# Patient Record
Sex: Male | Born: 1940
Health system: Southern US, Community
[De-identification: ages and names within clinical notes are randomized; demographics above are authoritative.]

## PROBLEM LIST (undated history)

## (undated) DIAGNOSIS — IMO0001 Reserved for inherently not codable concepts without codable children: Secondary | ICD-10-CM

## (undated) DIAGNOSIS — C4401 Basal cell carcinoma of skin of lip: Secondary | ICD-10-CM

## (undated) DIAGNOSIS — R569 Unspecified convulsions: Secondary | ICD-10-CM

## (undated) DIAGNOSIS — I10 Essential (primary) hypertension: Secondary | ICD-10-CM

## (undated) DIAGNOSIS — I2510 Atherosclerotic heart disease of native coronary artery without angina pectoris: Secondary | ICD-10-CM

## (undated) DIAGNOSIS — E785 Hyperlipidemia, unspecified: Secondary | ICD-10-CM

## (undated) DIAGNOSIS — N200 Calculus of kidney: Secondary | ICD-10-CM

## (undated) DIAGNOSIS — L409 Psoriasis, unspecified: Secondary | ICD-10-CM

## (undated) DIAGNOSIS — I4891 Unspecified atrial fibrillation: Secondary | ICD-10-CM

## (undated) DIAGNOSIS — I5030 Unspecified diastolic (congestive) heart failure: Secondary | ICD-10-CM

## (undated) DIAGNOSIS — E039 Hypothyroidism, unspecified: Secondary | ICD-10-CM

## (undated) HISTORY — DX: Psoriasis, unspecified: L40.9

## (undated) HISTORY — DX: Essential (primary) hypertension: I10

## (undated) HISTORY — DX: Basal cell carcinoma of skin of lip: C44.01

## (undated) HISTORY — DX: Calculus of kidney: N20.0

## (undated) HISTORY — DX: Unspecified diastolic (congestive) heart failure: I50.30

## (undated) HISTORY — DX: Hyperlipidemia, unspecified: E78.5

## (undated) HISTORY — DX: Hypothyroidism, unspecified: E03.9

## (undated) HISTORY — DX: Unspecified atrial fibrillation: I48.91

## (undated) HISTORY — DX: Atherosclerotic heart disease of native coronary artery without angina pectoris: I25.10

## (undated) HISTORY — DX: Reserved for inherently not codable concepts without codable children: IMO0001

---

## 1980-03-16 HISTORY — PX: TIBIA FRACTURE SURGERY: SHX806

## 1985-03-16 HISTORY — PX: KIDNEY STONE SURGERY: SHX686

## 2003-03-17 DIAGNOSIS — I2510 Atherosclerotic heart disease of native coronary artery without angina pectoris: Secondary | ICD-10-CM

## 2003-03-17 DIAGNOSIS — IMO0001 Reserved for inherently not codable concepts without codable children: Secondary | ICD-10-CM

## 2003-03-17 HISTORY — PX: CORONARY ANGIOPLASTY WITH STENT PLACEMENT: SHX49

## 2003-03-17 HISTORY — DX: Atherosclerotic heart disease of native coronary artery without angina pectoris: I25.10

## 2003-03-17 HISTORY — DX: Reserved for inherently not codable concepts without codable children: IMO0001

## 2003-03-17 HISTORY — PX: CARDIAC CATHETERIZATION: SHX172

## 2006-03-16 HISTORY — PX: HERNIA REPAIR: SHX51

## 2007-03-17 HISTORY — PX: SKIN CANCER EXCISION: SHX779

## 2009-01-01 ENCOUNTER — Ambulatory Visit: Payer: Self-pay | Admitting: Cardiovascular Disease

## 2009-04-03 ENCOUNTER — Ambulatory Visit: Payer: Self-pay | Admitting: Cardiovascular Disease

## 2009-09-06 ENCOUNTER — Ambulatory Visit: Payer: Self-pay | Admitting: Cardiovascular Disease

## 2009-09-12 ENCOUNTER — Ambulatory Visit: Payer: Self-pay

## 2009-09-12 ENCOUNTER — Encounter: Payer: Self-pay | Admitting: Cardiovascular Disease

## 2010-02-25 ENCOUNTER — Ambulatory Visit: Payer: Self-pay | Admitting: Cardiovascular Disease

## 2010-02-28 ENCOUNTER — Telehealth: Payer: Self-pay | Admitting: Cardiovascular Disease

## 2010-02-28 ENCOUNTER — Telehealth (INDEPENDENT_AMBULATORY_CARE_PROVIDER_SITE_OTHER): Payer: Self-pay | Admitting: *Deleted

## 2010-04-17 NOTE — Progress Notes (Signed)
Summary: stress test results  Phone Note Outgoing Call   Call placed by: Dessie Coma  LPN,  February 28, 2010 12:00 PM Call placed to: Patient Summary of Call: LMVM-notified patient per Dr. Kirke Corin, stress test was normal.  To call if any questions.

## 2010-04-17 NOTE — Progress Notes (Signed)
----   Converted from flag ---- ---- 02/26/2010 11:01 AM, Marilynne Halsted, CMA, AAMA wrote: No precert required.  Pt has Medicare and BCBS Medicare supplement.  ---- 02/25/2010 10:50 AM, Dessie Coma  LPN wrote: Nuclear Stress Test scheduled at [facility]. for 02/27/10 with Dx: 786.50.  Thanks,Jennifer ------------------------------

## 2010-07-29 NOTE — Assessment & Plan Note (Signed)
[facility]                        [place] CARDIOLOGY OFFICE NOTE   NAME:[name] [name], [name] [name]                      MRN:          [id number]  DATE:09/06/2009                            DOB:          05/15/1940    PROBLEM LIST:  1. Coronary artery disease status post Taxus 3.5- x 16-mm stent to the      RCA in 2005.  2. Hypertension.  3. Hyperlipidemia.  4. Hypothyroidism.  5. Remote history of tobacco use.   INTERVAL HISTORY:  The patient states since his last visit, he has been  doing well.  He has been doing some walking with his wife perhaps 2 to 3  times a week for 50-20 minutes each time.  He denies any episodes of  chest discomfort or dyspnea.  He has been compliant with his medications  and is doing well off the Plavix, which we stopped at his last office  visit.   SOCIAL HISTORY:  History of tobacco use.   PHYSICAL EXAMINATION:  VITAL SIGNS:  Blood pressure 104/69, pulse 69,  satting 97% on room air, and he weighs 221 pounds.  GENERAL:  No acute distress.  HEENT:  Normocephalic, atraumatic.  NECK:  Supple.  No JVD.  No carotid bruits.  HEART:  Regular rate and rhythm without murmur, rub, or gallop.  LUNGS:  Clear bilaterally.  ABDOMEN:  Soft, nontender, nondistended.  EXTREMITIES:  Trace edema.   Review of the patient's blood work drawn the day of his last office  visit, his CMP was within normal limits.  Total cholesterol was 139, HDL  56, triglyceride 66, LDL 70.   ASSESSMENT AND PLAN:  1. Coronary artery disease.  The patient is not having any angina.  He      should continue on aspirin 162 mg daily.  He has not had any issues      off his Plavix.  He is on a good dose of beta-blocker, statin,      vitamin D, and fish oil.  ACE inhibitor has not been started as his      ejection fraction is normal and his blood pressure is a bit on the      low side.  2. Hyperlipidemia.  His LDL is at goal on simvastatin 40 mg daily,      which he  should continue.  3. Hypertension.  Blood pressure is under excellent control.  He      should continue on Coreg.  4. We will order an one-time screening abdominal aortic aneurysm as      the patient does have a      history of tobacco use.  5. The patient is again encouraged to increase his physical activity      and keep an eye on his weight as he is a couple of pounds heavier      now than he was several months ago.     Brayton El, MD  Electronically Signed    SGA/MedQ  DD: 09/06/2009  DT: 09/07/2009  Job #: [id number]

## 2010-07-29 NOTE — Letter (Signed)
January 01, 2009    Philemon Kingdom, MD  [address]  [place] [address]   RE:  [name], [name]  MRN:  [id number]  /  DOB:  April 17, 1940   Dear Dr. Sudie Bailey:   I had the pleasure of seeing your patient, [name] [name], in  cardiology clinic today.  As you know, he is a 70 year old gentleman  with a history of coronary artery disease status post drug eluting stent  placement in 2005 who is presenting to me to establish cardiovascular  care.  The patient appears to be doing well from a cardiac standpoint.  He is not having any symptoms consistent with angina or heart failure.  His LDL is not yet under goal so I have increased his simvastatin to 40  mg daily.  I am in the process of obtaining his catheterization records  from [place].  At his next office visit, I will discuss his  antiplatelet regimen with him.  Although not unreasonable to continue  both aspirin and Plavix together; per the guidelines, he probably only  needs to be taking aspirin alone.  He is on a good medical therapy  otherwise.  I plan on seeing him back in 3 months time.   Thank you for the referral of [name] [name].  Please contact my office if  I can be of further assistance.    Sincerely,      Brayton El, MD  Electronically Signed    SGA/MedQ  DD: 01/01/2009  DT: 01/01/2009  Job #: [id number]

## 2010-07-29 NOTE — Assessment & Plan Note (Signed)
[facility]                        [place] CARDIOLOGY OFFICE NOTE   NAME:[name] [name], [name] [name]                      MRN:          [id number]  DATE:01/01/2009                            DOB:          06/28/1940    CHIEF COMPLAINT:  Establishing cardiovascular care.   HISTORY OF PRESENT ILLNESS:  [name] [name] is a 70 year old white male  with past medical history significant for coronary artery disease status  post PCI to the right coronary artery in 2005, who is now presenting to  establish cardiovascular care.  The patient states back in 2005, he was  told as an outpatient that he had heart attack based on his EKG.  He was  sent home from this visit, subsequently had some chest discomfort, was  admitted the hospital and had a Taxus stent placed in his right coronary  artery.  The patient states that the post-procedure course was  uneventful.  He states that over the past 5 years, he has not had any  recurrence of the chest discomfort.  He maintains a relatively active  life and that he does a lot of gardening and yard work.  The patient  denies any dyspnea on exertion, lower extremity edema, PND, orthopnea,  dizziness, or syncope.  He does state that he bruises rather easily as  he has been on full dose aspirin and Plavix ever since his intervention.   PAST MEDICAL HISTORY:  Coronary artery disease status post Taxus stent  (3.5 x 16 mm) to the right coronary artery in 2005, hypertension,  hyperlipidemia, basal cell skin cancer, arthritis.   SOCIAL HISTORY:  History of tobacco use but has not smoked in many  years, very rare alcohol use.   FAMILY HISTORY:  Positive for premature coronary disease.   ALLERGIES:  No known drug allergies.   MEDICATIONS:  1. Aspirin 325 daily.  2. Plavix 75 mg daily.  3. Coreg 12.5 mg b.i.d.  4. Simvastatin 20 mg daily.  5. Levothyroxine 25 mcg daily.  6. Vitamin D 400 international units daily.  7. Fish oil 1000 mg  b.i.d.  8. Glucosamine p.r.n.  9. Multiple skin creams for rosacea.  10.Nitroglycerin p.r.n.   REVIEW OF SYSTEMS:  The patient does endorse easy bruisability.  Other  systems as in HPI.  All other systems were reviewed and are negative.   PHYSICAL EXAMINATION:  VITAL SIGNS:  His blood pressure is 141/84, his  pulse is 57.  He weighs 218 pounds and he is sating 99% on room air.  GENERAL:  No acute distress.  HEENT:  Normocephalic, atraumatic.  He has a well-healed scar in his  left ear and over the right portion of his upper lip secondary to  dermatologic intervention.  NECK:  Supple.  There is no JVD.  There are no carotid bruits.  HEART:  Regular rate and rhythm without murmur, rub, or gallop.  LUNGS:  Clear to auscultation.  ABDOMEN:  Soft, nontender, nondistended.  EXTREMITIES:  Without edema.  SKIN:  Warm and dry.  EXTREMITIES:  Pulses 2+ bilateral carotid, radial and posterior tibial  pulses.  NEURO:  Nonfocal.  PSYCHIATRIC:  The patient is appropriate with normal levels of insight.   EKG from today independently interpreted by myself demonstrates normal  sinus bradycardia, with a ventricular rate of 55 beats per minute ,  evidence of an old inferior myocardial infarction approximately 0.5-mm  elevation in the inferior anterior lateral leads consistent with a early  repolarization.  I reviewed the patient's fasting lipid profile dated  December 31, 2008, total cholesterol 156, triglycerides 95, HDL 45, LDL  91.  Other labs from that visit are pending.  Of note, his blood  pressure during that visit was 122/83.   ASSESSMENT/PLAN:  1. Coronary artery disease - the patient is not having any symptoms      consistent with angina.  He is currently on aspirin 325 mg, Plavix      75 mg, and Coreg 12.5 mg b.i.d.  We would like to obtain the      results of his heart catheterization from 2005.  Today, we will      have the patient decrease his aspirin dose to 81 mg daily.  After       my review of the heart catheterization in that his next      appointment, we will consider further discussion with the patient      regarding the dual antiplatelet therapy.  A guideline stated that      only aspirin by itself is necessary this far out after      intervention.  Therefore, we will consider stopping the Plavix at      his next office visit.  2. Hyperlipidemia.  The patient is on simvastatin 20 mg daily with an      LDL of 91.  Today, we will increase the simvastatin to 40 mg daily      with the hopes of getting closer to his goal of 70.  3. Hypertension.  The patient's blood pressure is minimally elevated      today.  However, yesterday at Dr. Donita Brooks office, blood pressure      was within normal limits.  The patient also states that he checks      his blood pressure when he is out shopping and that it is almost      always within normal limits during those times.  Therefore, we will      not make any changes to his antihypertensive regimen now.  4. Left on supplements - the patient should continue on his fish oil      and vitamin D.  He is also recommended that he increase his      exercise regimen.  Ideally, this should be 30-60 minutes everyday      of the week.  He is instructed to contact our office if he were to      develop any chest discomfort or increase in dyspnea on exertion.      We will see the patient back in clinic in 3 months' time to monitor      his progress and to discuss his antiplatelet regimen.    Brayton El, MD  Electronically Signed   SGA/MedQ  DD: 01/01/2009  DT: 01/02/2009  Job #: [id number]

## 2010-07-29 NOTE — Assessment & Plan Note (Signed)
[facility]                        [place] CARDIOLOGY OFFICE NOTE   NAME:[name] [name], [name] [name]                      MRN:          [id number]  DATE:02/25/2010                            DOB:          09/26/1940    [name] [name] is a 70 year old gentleman who is here today for a followup  visit.  He has the following problem list:  1. Coronary artery disease status post myocardial infarction in 2005.      Cardiac catheterization at that time showed 99% mid right coronary      artery stenosis, 70% proximal ostial stenosis of the posterolateral      branch of the right coronary artery as well as moderate ostial      diagonal disease.  He underwent an angioplasty to the      posterolateral branch as well as angioplasty and a drug-eluting      stent placement to the mid right coronary artery with a Taxus 3.5 x      16-mm stent.  This was done in [facility].  2. Hypertension.  3. Hyperlipidemia.  4. Hypothyroidism.  5. Remote history of tobacco use.   INTERIM HISTORY:  The patient overall has been doing reasonably well.  Recently though he had some upper respiratory infection with bronchitis.  During that time he did not feel well and he had few episodes of chest  discomfort which is described as soreness in the chest with no  radiation.  He does have mild exertional dyspnea which has been overall  stable.  He has not had any recent cardiac evaluation with a stress  test.   MEDICATIONS:  1. Aspirin 162 mg once daily.  2. Carvedilol 12.5 mg twice daily.  3. Levothyroxine 25 mcg once daily.  4. Vitamin D once daily.  5. Fish oil 1000 mg twice daily.  6. Glucosamine  7. Simvastatin 40 mg at bedtime.   PHYSICAL EXAMINATION:  VITAL SIGNS: Weight is 223.6 pounds, blood  pressure is 116/71, pulse is 64, oxygen saturation is 97% on room air.  NECK:  No JVD or carotid bruits.  LUNGS:  Clear to auscultation.  HEART:  Regular rate and rhythm with no gallops or  murmurs.  ABDOMEN:  Benign, nontender, nondistended.  EXTREMITIES:  With no clubbing, cyanosis or edema.   IMPRESSION:  1. Coronary artery disease with recent episodes of chest pain which      seems to be different from his previous myocardial infarction.      However, he has known history of coronary artery disease and has      not had any recent cardiac evaluation.  I did an ECG which showed      normal sinus rhythm with evidence of prior inferior infarct and      diffuse ST-segment elevation consistent with early repolarization.      Due to his symptoms and previous cardiac history, I recommend      proceeding with a nuclear stress test for further evaluation.  Due      to his abnormal EKG with ST elevation, I do not think a treadmill  stress test would be diagnostic.  2. Hyperlipidemia.  His lipid profile was optimal last year.  It was      actually done in January 2011.  He is due for a repeat fasting      lipid and liver profile which will be ordered.  3. Hypertension.  Blood pressure is well controlled.  The patient will      be notified with the results of his testing.  He will follow up      with [place] in 6 months from now or earlier if needed.     Lorine Bears, MD  Electronically Signed    MA/MedQ  DD: 02/25/2010  DT: 02/26/2010  Job #: [id number]

## 2010-07-29 NOTE — Assessment & Plan Note (Signed)
[facility]                        [place] CARDIOLOGY OFFICE NOTE   NAME:[name] [name]                      MRN:          [id number]  DATE:04/03/2009                            DOB:          23-Aug-1940    PROBLEM LIST:  1. Coronary artery disease status post Taxus 3.5 x 16-mm stent to the      RCA in 2005.  2. Hypertension.  3. Hyperlipidemia.  4. Hypothyroidism.  5. Remote history of tobacco use.   INTERVAL HISTORY:  The patient states since his last visit, he has been  getting along quite well.  Since we decreased his dose of aspirin to 81  mg, he has had significantly less bruising.  He has been compliant with  medications.  He and his wife have been living with his mother-in-law  and that has taken up much of his time, therefore, he states he has not  had a lot of time to perform organized exercise.  She is moving to a  nursing home and he thinks this will free up some of their time for  exercise.  He states that his schedule has been very busy helping his  wife and his mother-in-law.  He denies any chest discomfort, shortness  of breath, dyspnea on exertion, or lower extremity edema.   PHYSICAL EXAMINATION:  VITAL SIGNS:  Blood pressure is 122/82, pulse of  62, he was satting 98% on room air, and he weighs 220 pounds.  GENERAL:  No acute distress.  HEENT:  Normocephalic, atraumatic.  NECK:  Supple.  There is no JVD.  There are no carotid bruits.  HEART:  Regular rate and rhythm without murmur, rub, or gallop.  LUNGS:  Clear bilaterally.  ABDOMEN:  Soft, nontender, nondistended.  There are no bruits  auscultated.  EXTREMITIES:  Without edema.  SKIN:  Warm and dry.  Pulses 2+ bilateral carotid, radial, and posterior  tibial pulses.   ASSESSMENT/PLAN:  1. Coronary artery disease.  The patient is not having any symptoms      consistent with angina.  He states that he will prefer to be on as      few medicines as possible and only those  that are medically      necessary.  For this reason, we will have him stop Plavix and      increase his aspirin dose to 162 mg daily.  He understands that per      the guidelines, he does not need to be on Plavix as his PCI was      approximately 5 years ago.  He is reminded that if he experiences      any new-onset of chest discomfort that is not quickly relieved that      he should call 911.  He should continue beta-blocker and statin as      well vitamin D and fish oil.  2. Hyperlipidemia.  At the last visit, we increased his simvastatin to      40 mg because his LDL was 91.  We will recheck a fasting lipid      profile today.  3. Hypertension.  His blood pressure is under excellent control, and      he should continue on the Coreg.  4. The patient is encouraged to engage in increased and organized      levels of physical activity.  We recommend at least 30 minutes      every day of the week, and he states he will try to comply with      this recommendation.  5. At the next office visit, we could consider performing an one-time      screening ultrasound to rule out abdominal aortic aneurysm as the      patient does have history of tobacco use.     Brayton El, MD  Electronically Signed    SGA/MedQ  DD: 04/03/2009  DT: 04/03/2009  Job #: [id number]   cc:   Philemon Kingdom

## 2010-09-01 ENCOUNTER — Ambulatory Visit: Payer: Self-pay | Admitting: Cardiovascular Disease

## 2010-09-09 ENCOUNTER — Encounter: Payer: Self-pay | Admitting: Cardiovascular Disease

## 2010-09-11 ENCOUNTER — Encounter: Payer: Self-pay | Admitting: Cardiovascular Disease

## 2010-09-15 ENCOUNTER — Encounter: Payer: Self-pay | Admitting: Cardiovascular Disease

## 2010-09-15 ENCOUNTER — Ambulatory Visit (INDEPENDENT_AMBULATORY_CARE_PROVIDER_SITE_OTHER): Payer: Medicare Other | Admitting: Cardiovascular Disease

## 2010-09-15 DIAGNOSIS — I2510 Atherosclerotic heart disease of native coronary artery without angina pectoris: Secondary | ICD-10-CM | POA: Insufficient documentation

## 2010-09-15 DIAGNOSIS — I10 Essential (primary) hypertension: Secondary | ICD-10-CM | POA: Insufficient documentation

## 2010-09-15 DIAGNOSIS — E785 Hyperlipidemia, unspecified: Secondary | ICD-10-CM | POA: Insufficient documentation

## 2010-09-15 NOTE — Assessment & Plan Note (Signed)
He had his lipid profile checked last month which showed a total cholesterol of 141, triglycerides 95, HDL 45 and an LDL of 77. I discussed with him that ideally his LDL  should be less than 70. I suggested switching to atorvastatin or Crestor. However, the patient would like to improve his diet first and see if we can achieve the target. He is going to cut down on ice cream intake.

## 2010-09-15 NOTE — Patient Instructions (Signed)
Your physician recommends that you schedule a follow-up appointment in: 6 months  

## 2010-09-15 NOTE — Assessment & Plan Note (Signed)
The patient is doing very well at this time without any symptoms suggestive of angina. His last stress test 6 months ago was within normal limits. Continue medical therapy.

## 2010-09-15 NOTE — Progress Notes (Signed)
HPI  This is  a 70 year old man who is here today for followup visit. He has known history of coronary artery disease status post non-ST elevation myocardial infarction in 2005 with subsequent angioplasty and drug-eluting stent placement to the mid right coronary artery as well as ostial posterolateral branch. This was done at [facility]. He has not had any cardiac events since then. He had some atypical chest discomfort during his last visit. He was evaluated with a nuclear stress test which showed no evidence of ischemia or infarcts. He has been doing very well since his last visit. He is not having any chest pain, dyspnea, palpitations, syncope or presyncope.   No Known Allergies   Current Outpatient Prescriptions on File Prior to Visit  Medication Sig Dispense Refill  . aspirin 81 MG tablet Take 162 mg by mouth daily.        . carvedilol (COREG) 12.5 MG tablet Take 12.5 mg by mouth 2 (two) times daily with a meal.        . cholecalciferol (VITAMIN D) 1000 UNITS tablet Take 1,000 Units by mouth daily.        . fish oil-omega-3 fatty acids 1000 MG capsule Take 1 g by mouth 2 (two) times daily.        . Glucosamine-Chondroit-Vit C-Mn (GLUCOSAMINE 1500 COMPLEX PO) Take 2 tablets by mouth 2 (two) times daily.        Marland Kitchen levothyroxine (SYNTHROID, LEVOTHROID) 25 MCG tablet Take 25 mcg by mouth daily.        . nitroGLYCERIN (NITROSTAT) 0.4 MG SL tablet Place 0.4 mg under the tongue every 5 (five) minutes as needed.        . simvastatin (ZOCOR) 40 MG tablet Take 40 mg by mouth at bedtime.           Past Medical History  Diagnosis Date  . Hypothyroidism   . CAD (coronary artery disease)   . HLD (hyperlipidemia)   . HTN (hypertension)   . Myocardial infarction, nontransmural, subendocardial 2005     Past Surgical History  Procedure Date  . Hernia repair 2008  . Skin cancer excision 2009    upper lip  . Tibia fracture surgery 1982    right  . Kidney stone surgery 1987  . Cardiac  catheterization 2005    RCA: 99% mid. RPL: 70% ostial,   . Coronary angioplasty with stent placement 2005    RCA and RPL (DES)     Family History  Problem Relation Age of Onset  . Heart failure       History   Social History  . Marital Status: Married    Spouse Name: N/A    Number of Children: 3  . Years of Education: N/A   Occupational History  . retired    Social History Main Topics  . Smoking status: Former Smoker -- 0.3 packs/day for 30 years    Quit date: 01/14/1993  . Smokeless tobacco: Not on file  . Alcohol Use: 1.2 oz/week    2 Cans of beer per week  . Drug Use: No  . Sexually Active: Not on file   Other Topics Concern  . Not on file   Social History Narrative  . No narrative on file       PHYSICAL EXAM   BP 128/75  Pulse 59  Ht 5\' 9"  (1.753 m)  Wt 218 lb (98.884 kg)  BMI 32.19 kg/m2  SpO2 100%  Constitutional: He is oriented to person, place, and  time. He appears well-developed and well-nourished. No distress.  HENT: No nasal discharge.  Head: Normocephalic and atraumatic.  Eyes: Pupils are equal, round, and reactive to light. Right eye exhibits no discharge. Left eye exhibits no discharge.  Neck: Normal range of motion. Neck supple. No JVD present. No thyromegaly present.  Cardiovascular: Normal rate, regular rhythm, normal heart sounds and intact distal pulses. Exam reveals no gallop and no friction rub.  No murmur heard.  Pulmonary/Chest: Effort normal and breath sounds normal. No stridor. No respiratory distress. He has no wheezes. He has no rales. He exhibits no tenderness.  Abdominal: Soft. Bowel sounds are normal. He exhibits no distension. There is no tenderness. There is no rebound and no guarding.  Musculoskeletal: Normal range of motion. He exhibits no edema and no tenderness.  Neurological: He is alert and oriented to person, place, and time. Coordination normal.  Skin: Skin is warm and dry. No rash noted. He is not diaphoretic. No  erythema. No pallor.  Psychiatric: He has a normal mood and affect. His behavior is normal. Judgment and thought content normal.      ASSESSMENT AND PLAN

## 2010-09-15 NOTE — Assessment & Plan Note (Signed)
His blood pressure is well controlled. Continue current medications. 

## 2011-02-17 ENCOUNTER — Telehealth: Payer: Self-pay | Admitting: Cardiology

## 2011-02-17 ENCOUNTER — Encounter: Payer: Self-pay | Admitting: Cardiology

## 2011-02-17 ENCOUNTER — Ambulatory Visit (INDEPENDENT_AMBULATORY_CARE_PROVIDER_SITE_OTHER): Payer: Medicare Other | Admitting: Cardiology

## 2011-02-17 DIAGNOSIS — I4891 Unspecified atrial fibrillation: Secondary | ICD-10-CM | POA: Insufficient documentation

## 2011-02-17 DIAGNOSIS — I2510 Atherosclerotic heart disease of native coronary artery without angina pectoris: Secondary | ICD-10-CM

## 2011-02-17 DIAGNOSIS — E785 Hyperlipidemia, unspecified: Secondary | ICD-10-CM

## 2011-02-17 DIAGNOSIS — R0602 Shortness of breath: Secondary | ICD-10-CM

## 2011-02-17 LAB — CBC WITH DIFFERENTIAL/PLATELET
Basophils Absolute: 0 10*3/uL (ref 0.0–0.1)
Basophils Relative: 0.9 % (ref 0.0–3.0)
Eosinophils Absolute: 0.2 10*3/uL (ref 0.0–0.7)
Hemoglobin: 13.6 g/dL (ref 13.0–17.0)
Lymphocytes Relative: 21.1 % (ref 12.0–46.0)
MCHC: 34.7 g/dL (ref 30.0–36.0)
Monocytes Relative: 9.5 % (ref 3.0–12.0)
Neutro Abs: 3.1 10*3/uL (ref 1.4–7.7)
Neutrophils Relative %: 65.3 % (ref 43.0–77.0)
RBC: 4.07 Mil/uL — ABNORMAL LOW (ref 4.22–5.81)
RDW: 15 % — ABNORMAL HIGH (ref 11.5–14.6)

## 2011-02-17 MED ORDER — WARFARIN SODIUM 5 MG PO TABS: ORAL_TABLET | ORAL | Status: DC

## 2011-02-17 MED ORDER — RIVAROXABAN 20 MG PO TABS: 20.0000 mg | ORAL_TABLET | Freq: Every day | ORAL | Status: DC

## 2011-02-17 NOTE — Assessment & Plan Note (Signed)
Stable with no ischemic symptoms.  Continue ASA 81, Coreg, statin.

## 2011-02-17 NOTE — Telephone Encounter (Signed)
I reviewed with Dr Shirlee Latch. OK for pt to start warfarin 5mg  daily instead of Xarelto. Will schedule an appointment in [facility] for 02/23/11

## 2011-02-17 NOTE — Telephone Encounter (Signed)
LMTCB

## 2011-02-17 NOTE — Patient Instructions (Signed)
Start Xarelto 20mg  daily.  Your physician recommends that you have lab work today-CBC/BNP 427.31  414.01  Your physician has requested that you have an echocardiogram. Echocardiography is a painless test that uses sound waves to create images of your heart. It provides your doctor with information about the size and shape of your heart and how well your heart's chambers and valves are working. This procedure takes approximately one hour. There are no restrictions for this procedure.  Your physician recommends that you schedule a follow-up appointment in: 1 month with Dr Shirlee Latch.

## 2011-02-17 NOTE — Progress Notes (Signed)
PCP: Dr. Sudie Bailey  70 yo with history of CAD s/p PCI in 2005 presents for cardiology followup.  Patient has been seen by Dr. Kirke Corin in [place] at prior visits and is seen by me for the first time today.  By ECG, patient was noted to be in atrial fibrillation today.  This has not been mentioned in past notes, and the patient said that he had never been told he has atrial fibrillation.  HR was controlled in the 60s.  Patient has had no recent increase in exertional dyspnea.  He is mildly short of breath walking up a hill but otherwise does ok.  He does yardwork with no dyspnea as long as he paces himself.  He has some generalized fatigue that is chronic.  No chest pain.  No orthopnea or PND.  No syncope.   No stroke-like symptoms.    ECG: atrial fibrillation at 66, inferior Qs.    Labs (10/12): K 4.2, creatinine 1.0, LDL 70, HDL 54, AST/ALT ok.  PMH: 1. Gilberts syndrome 2. CAD: NSTEMI in 2005 with DES to mid RCA and ostial PLV ([facility]).  Myoview in 1/12 with no ischemia or infarction.  3. Hypothyroidism 4. HTN  5. Nephrolithiasis 6. Hyperlipidemia 7. Atrial fibrillation: First noted in 12/12.   SH: Married (Brenda Ibach).  3 children.  Lives in [place].  Retired Art gallery manager.  Quit smoking in 1994.   FH: No premature CAD  ROS: All systems reviewed and negative except as per HPI.   Current Outpatient Prescriptions  Medication Sig Dispense Refill  . aspirin 81 MG tablet Take 162 mg by mouth daily.        . carvedilol (COREG) 12.5 MG tablet Take 12.5 mg by mouth 2 (two) times daily with a meal.        . fish oil-omega-3 fatty acids 1000 MG capsule Take 1 g by mouth 2 (two) times daily.        . Glucosamine-Chondroit-Vit C-Mn (GLUCOSAMINE 1500 COMPLEX PO) Take 2 tablets by mouth 2 (two) times daily.        Marland Kitchen levothyroxine (SYNTHROID, LEVOTHROID) 50 MCG tablet Take 50 mcg by mouth daily.        . nitroGLYCERIN (NITROSTAT) 0.4 MG SL tablet Place 0.4 mg under the tongue every 5 (five) minutes  as needed.        . simvastatin (ZOCOR) 40 MG tablet Take 40 mg by mouth at bedtime.        Marland Kitchen warfarin (COUMADIN) 5 MG tablet Take one daily or as directed  30 tablet  0    BP 114/72  Pulse 66  Ht 5\' 9"  (1.753 m)  Wt 98.884 kg (218 lb)  BMI 32.19 kg/m2 General: NAD, overweight.  Neck: JVP 7-8 cm, no thyromegaly or thyroid nodule.  Lungs: Clear to auscultation bilaterally with normal respiratory effort. CV: Nondisplaced PMI.  Heart irregular S1/S2, no S3/S4, no murmur.  1+ ankle edema with lower leg varicosities.  No carotid bruit.  Normal pedal pulses.  Abdomen: Soft, nontender, no hepatosplenomegaly, no distention.  Neurologic: Alert and oriented x 3.  Psych: Normal affect. Extremities: No clubbing or cyanosis.

## 2011-02-17 NOTE — Telephone Encounter (Signed)
New problem Pt calling about xarelto He said it is too expensive  Please call him back

## 2011-02-17 NOTE — Telephone Encounter (Signed)
I talked with pt's wife. Pt did not pick up Xarelto from pharmacy due to cost. Pt's wife states pt  is willing to take coumadin. I will forward to Dr Shirlee Latch for review.

## 2011-02-17 NOTE — Assessment & Plan Note (Signed)
Atrial fibrillation today with good rate control.  I am unsure how long he has been in atrial fibrillation.  Only cardiopulmonary symptoms are mild exertional dyspnea with heavier exertion and some generalized fatigue.  Both of these have been going on for months.  He was not in atrial fibrillation at 7/12 visit.  CHADSVASC score is 3.   - I think he needs to be anticoagulated.  We checked into rivaroxaban, but with his insurance it will be too expensive.  Therefore, he will start coumadin.  - Continue current Coreg dose given good rate control.  - Echocardiogram - Check BNP, CBC - As this is his first documented atrial fibrillation episode, it would be reasonable to cardiovert him to see if he will stay in sinus rhythm.  He has mild exertional dyspnea that could be related to atrial fibrillation or that alternatively may be due to obesity/deconditioning.  I will arrange a cardioversion after 4 weeks of therapeutic INR on coumadin if he remains in atrial fibrillation.

## 2011-02-17 NOTE — Assessment & Plan Note (Addendum)
Goal LDL < 70.  Last LDL was 70.  Continue current statin.   I spent > 40 minutes talking to patient and reviewing old records.

## 2011-02-18 ENCOUNTER — Ambulatory Visit (HOSPITAL_COMMUNITY): Payer: Medicare Other | Attending: Cardiovascular Disease | Admitting: Radiology

## 2011-02-18 DIAGNOSIS — I079 Rheumatic tricuspid valve disease, unspecified: Secondary | ICD-10-CM | POA: Insufficient documentation

## 2011-02-18 DIAGNOSIS — R0989 Other specified symptoms and signs involving the circulatory and respiratory systems: Secondary | ICD-10-CM | POA: Insufficient documentation

## 2011-02-18 DIAGNOSIS — R0609 Other forms of dyspnea: Secondary | ICD-10-CM | POA: Insufficient documentation

## 2011-02-18 DIAGNOSIS — I2510 Atherosclerotic heart disease of native coronary artery without angina pectoris: Secondary | ICD-10-CM

## 2011-02-18 DIAGNOSIS — I4891 Unspecified atrial fibrillation: Secondary | ICD-10-CM | POA: Insufficient documentation

## 2011-02-18 DIAGNOSIS — I059 Rheumatic mitral valve disease, unspecified: Secondary | ICD-10-CM | POA: Insufficient documentation

## 2011-02-18 DIAGNOSIS — I379 Nonrheumatic pulmonary valve disorder, unspecified: Secondary | ICD-10-CM | POA: Insufficient documentation

## 2011-02-23 ENCOUNTER — Ambulatory Visit: Payer: Self-pay

## 2011-02-23 ENCOUNTER — Ambulatory Visit (INDEPENDENT_AMBULATORY_CARE_PROVIDER_SITE_OTHER): Payer: Medicare Other | Admitting: *Deleted

## 2011-02-23 DIAGNOSIS — Z7901 Long term (current) use of anticoagulants: Secondary | ICD-10-CM

## 2011-02-23 DIAGNOSIS — I4891 Unspecified atrial fibrillation: Secondary | ICD-10-CM

## 2011-02-23 NOTE — Patient Instructions (Signed)

## 2011-03-02 ENCOUNTER — Ambulatory Visit (INDEPENDENT_AMBULATORY_CARE_PROVIDER_SITE_OTHER): Payer: Medicare Other | Admitting: *Deleted

## 2011-03-02 ENCOUNTER — Telehealth: Payer: Self-pay | Admitting: Pharmacist

## 2011-03-02 DIAGNOSIS — I4891 Unspecified atrial fibrillation: Secondary | ICD-10-CM

## 2011-03-02 DIAGNOSIS — Z7901 Long term (current) use of anticoagulants: Secondary | ICD-10-CM

## 2011-03-02 LAB — POCT INR: INR: 2.8

## 2011-03-02 MED ORDER — WARFARIN SODIUM 5 MG PO TABS: ORAL_TABLET | ORAL | Status: DC

## 2011-03-02 NOTE — Telephone Encounter (Signed)
Pt request 90-day supply of warfarin to [facility]

## 2011-03-05 NOTE — Progress Notes (Signed)
Addended by: Jacqlyn Krauss on: 03/05/2011 04:48 PM   Modules accepted: Orders

## 2011-03-11 ENCOUNTER — Ambulatory Visit (INDEPENDENT_AMBULATORY_CARE_PROVIDER_SITE_OTHER): Payer: Medicare Other | Admitting: *Deleted

## 2011-03-11 DIAGNOSIS — Z7901 Long term (current) use of anticoagulants: Secondary | ICD-10-CM

## 2011-03-11 DIAGNOSIS — I4891 Unspecified atrial fibrillation: Secondary | ICD-10-CM

## 2011-03-11 LAB — POCT INR: INR: 4.3

## 2011-03-18 ENCOUNTER — Ambulatory Visit (INDEPENDENT_AMBULATORY_CARE_PROVIDER_SITE_OTHER): Payer: Medicare Other | Admitting: *Deleted

## 2011-03-18 DIAGNOSIS — I4891 Unspecified atrial fibrillation: Secondary | ICD-10-CM

## 2011-03-18 DIAGNOSIS — Z7901 Long term (current) use of anticoagulants: Secondary | ICD-10-CM

## 2011-03-18 LAB — POCT INR: INR: 3.4

## 2011-03-18 NOTE — Patient Instructions (Signed)
Discussed leafy green vegetable intake consistency.

## 2011-03-24 ENCOUNTER — Encounter: Payer: Self-pay | Admitting: Physician Assistant

## 2011-03-24 ENCOUNTER — Ambulatory Visit (INDEPENDENT_AMBULATORY_CARE_PROVIDER_SITE_OTHER): Payer: Medicare Other | Admitting: Physician Assistant

## 2011-03-24 ENCOUNTER — Encounter: Payer: Medicare Other | Admitting: *Deleted

## 2011-03-24 VITALS — BP 140/72 | HR 55 | Ht 69.0 in | Wt 228.0 lb

## 2011-03-24 DIAGNOSIS — I2510 Atherosclerotic heart disease of native coronary artery without angina pectoris: Secondary | ICD-10-CM | POA: Diagnosis not present

## 2011-03-24 DIAGNOSIS — I4891 Unspecified atrial fibrillation: Secondary | ICD-10-CM | POA: Diagnosis not present

## 2011-03-24 DIAGNOSIS — I10 Essential (primary) hypertension: Secondary | ICD-10-CM

## 2011-03-24 MED ORDER — NITROGLYCERIN 0.4 MG SL SUBL: 0.4000 mg | SUBLINGUAL_TABLET | SUBLINGUAL | Status: DC | PRN

## 2011-03-24 NOTE — Assessment & Plan Note (Signed)
No angina.  Continue ASA and statin. 

## 2011-03-24 NOTE — Assessment & Plan Note (Addendum)
Patient in NSR today.  No symptoms.  We discussed his significant TE risk.  He agrees to remain on coumadin.  Patient also seen by Dr. Marca Ancona today (was supposed to be on his schedule this week but appt changed for unclear reasons).  Follow up in 6 months.

## 2011-03-24 NOTE — Progress Notes (Signed)
[address] [place], [place]  [address] Phone: [phone] Fax:  [phone]  Date:  03/24/2011   Name:  [name] [name]       DOB:  [birth year] MRN:  [id number]  PCP:  Dr. Sudie Bailey Primary Cardiologist:  Dr. Marca Ancona  Primary Electrophysiologist:  None    History of Present Illness: [name] [name] is a 71 y.o. male who presents for follow up.  He established with Dr. Marca Ancona last month after being followed by Dr. Lorine Bears.  He has a history of CAD s/p PCI in 2005.  At his last visit, he was noted to be in atrial fibrillation. This had not been mentioned in past notes, and the patient said that he had never been told he has atrial fibrillation. HR was controlled in the 60s.  He was started on Coumadin 2/2 significant thromboembolic risk factor profile.  His rate was controlled.  Echocardiogram 02/18/11: Moderate LVH, EF 55-60%, grade 2 diastolic dysfunction, mild RAE.  The plan is to try to cardiovert him back to normal sinus rhythm after 4 weeks therapeutic anticoagulation.  He is back in NSR.  He was actually noted to be in NSR when he came with his wife for an appt recently.  Denies palpitations.  The patient denies chest pain, syncope, orthopnea, PND or significant pedal edema.  He has DOE that is mild without significant change.  Recent BNP was mildly elevated and was likely related to AFib.   Past Medical History: 1. Gilberts syndrome  2. CAD: NSTEMI in 2005 with DES to mid RCA and ostial PLV ([facility]). Myoview in 1/12 with no ischemia or infarction.  3. Hypothyroidism  4. HTN  5. Nephrolithiasis  6. Hyperlipidemia  7. Paroxysmal Atrial fibrillation: First noted in 12/12.  Coumadin initiated 12/12.   Current Outpatient Prescriptions  Medication Sig Dispense Refill  . aspirin 81 MG tablet Take 162 mg by mouth daily.        . carvedilol (COREG) 12.5 MG tablet Take 12.5 mg by mouth 2 (two) times daily with a meal.        . fish  oil-omega-3 fatty acids 1000 MG capsule Take 1 g by mouth 2 (two) times daily.        . Glucosamine-Chondroit-Vit C-Mn (GLUCOSAMINE 1500 COMPLEX PO) Take 2 tablets by mouth 2 (two) times daily.        Marland Kitchen levothyroxine (SYNTHROID, LEVOTHROID) 50 MCG tablet Take 50 mcg by mouth daily.        . nitroGLYCERIN (NITROSTAT) 0.4 MG SL tablet Place 0.4 mg under the tongue every 5 (five) minutes as needed.        . simvastatin (ZOCOR) 40 MG tablet Take 40 mg by mouth at bedtime.        Marland Kitchen warfarin (COUMADIN) 5 MG tablet Take one daily or as directed  105 tablet  0    Allergies: No Known Allergies  History  Substance Use Topics  . Smoking status: Former Smoker -- 0.3 packs/day for 30 years    Quit date: 01/14/1993  . Smokeless tobacco: Not on file  . Alcohol Use: 1.2 oz/week    2 Cans of beer per week      PHYSICAL EXAM: VS:  BP 140/72  Pulse 55  Ht 5\' 9"  (1.753 m)  Wt 228 lb (103.42 kg)  BMI 33.67 kg/m2 Well nourished, well developed, in no acute distress HEENT: normal Neck: no JVD Cardiac:  normal S1, S2; RRR; no murmur Lungs:  clear to auscultation bilaterally, no wheezing, rhonchi or rales Abd: soft, nontender, no hepatomegaly Ext: trace to 1+ bilateral edema Skin: warm and dry Neuro:  CNs 2-12 intact, no focal abnormalities noted  EKG:  Sinus brady, HR 55, normal axis, PRWP, no ischemic changes  ASSESSMENT AND PLAN:

## 2011-03-24 NOTE — Patient Instructions (Addendum)
Your physician wants you to follow-up with Dr.Mclean in 6 months.You will receive a reminder letter in the mail two months in advance. If you don't receive a letter, please call our office to schedule the follow-up appointment.

## 2011-03-24 NOTE — Assessment & Plan Note (Signed)
A little elevated today.  But he is upset about his appt change.  No change in Rx.

## 2011-03-26 ENCOUNTER — Ambulatory Visit: Payer: BLUE CROSS/BLUE SHIELD | Admitting: Cardiology

## 2011-04-01 ENCOUNTER — Ambulatory Visit (INDEPENDENT_AMBULATORY_CARE_PROVIDER_SITE_OTHER): Payer: Medicare Other | Admitting: *Deleted

## 2011-04-01 DIAGNOSIS — Z7901 Long term (current) use of anticoagulants: Secondary | ICD-10-CM | POA: Diagnosis not present

## 2011-04-01 DIAGNOSIS — I4891 Unspecified atrial fibrillation: Secondary | ICD-10-CM

## 2011-04-15 ENCOUNTER — Ambulatory Visit (INDEPENDENT_AMBULATORY_CARE_PROVIDER_SITE_OTHER): Payer: Medicare Other | Admitting: *Deleted

## 2011-04-15 ENCOUNTER — Encounter: Payer: Medicare Other | Admitting: *Deleted

## 2011-04-15 DIAGNOSIS — Z7901 Long term (current) use of anticoagulants: Secondary | ICD-10-CM

## 2011-04-15 DIAGNOSIS — I4891 Unspecified atrial fibrillation: Secondary | ICD-10-CM

## 2011-05-05 DIAGNOSIS — I10 Essential (primary) hypertension: Secondary | ICD-10-CM | POA: Diagnosis not present

## 2011-05-05 DIAGNOSIS — E039 Hypothyroidism, unspecified: Secondary | ICD-10-CM | POA: Diagnosis not present

## 2011-05-05 DIAGNOSIS — D649 Anemia, unspecified: Secondary | ICD-10-CM | POA: Diagnosis not present

## 2011-05-05 DIAGNOSIS — Z79899 Other long term (current) drug therapy: Secondary | ICD-10-CM | POA: Diagnosis not present

## 2011-05-05 DIAGNOSIS — E785 Hyperlipidemia, unspecified: Secondary | ICD-10-CM | POA: Diagnosis not present

## 2011-05-05 DIAGNOSIS — I2510 Atherosclerotic heart disease of native coronary artery without angina pectoris: Secondary | ICD-10-CM | POA: Diagnosis not present

## 2011-05-13 ENCOUNTER — Encounter: Payer: Medicare Other | Admitting: *Deleted

## 2011-05-14 ENCOUNTER — Ambulatory Visit (INDEPENDENT_AMBULATORY_CARE_PROVIDER_SITE_OTHER): Payer: Medicare Other | Admitting: *Deleted

## 2011-05-14 DIAGNOSIS — Z7901 Long term (current) use of anticoagulants: Secondary | ICD-10-CM

## 2011-05-14 DIAGNOSIS — I4891 Unspecified atrial fibrillation: Secondary | ICD-10-CM

## 2011-05-25 ENCOUNTER — Ambulatory Visit: Payer: BLUE CROSS/BLUE SHIELD | Admitting: Cardiology

## 2011-06-11 ENCOUNTER — Ambulatory Visit (INDEPENDENT_AMBULATORY_CARE_PROVIDER_SITE_OTHER): Payer: Medicare Other | Admitting: Pharmacist

## 2011-06-11 DIAGNOSIS — I4891 Unspecified atrial fibrillation: Secondary | ICD-10-CM

## 2011-06-11 DIAGNOSIS — Z7901 Long term (current) use of anticoagulants: Secondary | ICD-10-CM

## 2011-07-01 ENCOUNTER — Other Ambulatory Visit: Payer: Self-pay | Admitting: Cardiology

## 2011-07-09 ENCOUNTER — Ambulatory Visit (INDEPENDENT_AMBULATORY_CARE_PROVIDER_SITE_OTHER): Payer: Medicare Other

## 2011-07-09 DIAGNOSIS — Z7901 Long term (current) use of anticoagulants: Secondary | ICD-10-CM

## 2011-07-09 DIAGNOSIS — I4891 Unspecified atrial fibrillation: Secondary | ICD-10-CM | POA: Diagnosis not present

## 2011-08-05 ENCOUNTER — Ambulatory Visit (INDEPENDENT_AMBULATORY_CARE_PROVIDER_SITE_OTHER): Payer: Medicare Other | Admitting: Pharmacist

## 2011-08-05 DIAGNOSIS — I4891 Unspecified atrial fibrillation: Secondary | ICD-10-CM | POA: Diagnosis not present

## 2011-08-05 DIAGNOSIS — Z7901 Long term (current) use of anticoagulants: Secondary | ICD-10-CM | POA: Diagnosis not present

## 2011-08-24 ENCOUNTER — Telehealth: Payer: Self-pay | Admitting: Cardiology

## 2011-08-24 MED ORDER — SIMVASTATIN 40 MG PO TABS: 40.0000 mg | ORAL_TABLET | Freq: Every day | ORAL | Status: DC

## 2011-08-24 NOTE — Telephone Encounter (Signed)
New Problem:    Patient called in needing a 90 day refill of his simvastatin (ZOCOR) 40 MG tablet.

## 2011-09-02 DIAGNOSIS — I10 Essential (primary) hypertension: Secondary | ICD-10-CM | POA: Diagnosis not present

## 2011-09-02 DIAGNOSIS — I2510 Atherosclerotic heart disease of native coronary artery without angina pectoris: Secondary | ICD-10-CM | POA: Diagnosis not present

## 2011-09-02 DIAGNOSIS — E785 Hyperlipidemia, unspecified: Secondary | ICD-10-CM | POA: Diagnosis not present

## 2011-09-02 DIAGNOSIS — E039 Hypothyroidism, unspecified: Secondary | ICD-10-CM | POA: Diagnosis not present

## 2011-09-02 DIAGNOSIS — Z79899 Other long term (current) drug therapy: Secondary | ICD-10-CM | POA: Diagnosis not present

## 2011-09-02 DIAGNOSIS — I4891 Unspecified atrial fibrillation: Secondary | ICD-10-CM | POA: Diagnosis not present

## 2011-09-15 ENCOUNTER — Ambulatory Visit (INDEPENDENT_AMBULATORY_CARE_PROVIDER_SITE_OTHER): Payer: Medicare Other | Admitting: *Deleted

## 2011-09-15 DIAGNOSIS — I4891 Unspecified atrial fibrillation: Secondary | ICD-10-CM | POA: Diagnosis not present

## 2011-09-15 DIAGNOSIS — Z7901 Long term (current) use of anticoagulants: Secondary | ICD-10-CM

## 2011-09-15 LAB — POCT INR: INR: 2.7

## 2011-09-25 ENCOUNTER — Ambulatory Visit (INDEPENDENT_AMBULATORY_CARE_PROVIDER_SITE_OTHER): Payer: Medicare Other | Admitting: Cardiology

## 2011-09-25 ENCOUNTER — Encounter: Payer: Self-pay | Admitting: Cardiology

## 2011-09-25 VITALS — BP 128/74 | HR 59 | Ht 69.0 in | Wt 220.0 lb

## 2011-09-25 DIAGNOSIS — I2510 Atherosclerotic heart disease of native coronary artery without angina pectoris: Secondary | ICD-10-CM | POA: Diagnosis not present

## 2011-09-25 DIAGNOSIS — I4891 Unspecified atrial fibrillation: Secondary | ICD-10-CM

## 2011-09-25 DIAGNOSIS — E785 Hyperlipidemia, unspecified: Secondary | ICD-10-CM

## 2011-09-25 NOTE — Progress Notes (Signed)
Patient ID: [name] [name], male   DOB: September 02, 1940, 71 y.o.   MRN: [id number] PCP: Dr. Sudie Bailey  71 yo with history of CAD s/p PCI in 2005 and paroxysmal atrial fibrillation presents for cardiology followup.  He is mildly short of breath walking up a hill but otherwise does ok.  He does yardwork with no dyspnea.  He has some generalized fatigue that is chronic.  No chest pain.  No orthopnea or PND.  No syncope.   No stroke-like symptoms.  He is on coumadin for PAF.  Weight is down 8 lbs since last appointment.   ECG: NSR, normal  Labs (10/12): K 4.2, creatinine 1.0, LDL 70, HDL 54, AST/ALT ok. Labs (6/13): K 4.1, creatinine 0.86, HCT 40.8, LDL 69, HDL 49  PMH: 1. Gilberts syndrome 2. CAD: NSTEMI in 2005 with DES to mid RCA and ostial PLV ([facility]).  Myoview in 1/12 with no ischemia or infarction. Echo (12/12): EF 55-60%, moderate LVH, grade II diastolic dysfunction.  3. Hypothyroidism 4. HTN  5. Nephrolithiasis 6. Hyperlipidemia 7. Atrial fibrillation: First noted in 12/12.   SH: Married (Brenda Aten).  3 children.  Lives in [place].  Retired Art gallery manager.  Quit smoking in 1994.   FH: No premature CAD   Current Outpatient Prescriptions  Medication Sig Dispense Refill  . carvedilol (COREG) 12.5 MG tablet Take 12.5 mg by mouth 2 (two) times daily with a meal.        . fish oil-omega-3 fatty acids 1000 MG capsule Take 1 g by mouth 2 (two) times daily.        . Glucosamine-Chondroit-Vit C-Mn (GLUCOSAMINE 1500 COMPLEX PO) Take 1 tablet by mouth 2 (two) times daily.       Marland Kitchen levothyroxine (SYNTHROID, LEVOTHROID) 50 MCG tablet Take 50 mcg by mouth daily.        . nitroGLYCERIN (NITROSTAT) 0.4 MG SL tablet Place 1 tablet (0.4 mg total) under the tongue every 5 (five) minutes as needed.  25 tablet  11  . simvastatin (ZOCOR) 40 MG tablet Take 1 tablet (40 mg total) by mouth at bedtime.  30 tablet  2  . warfarin (COUMADIN) 5 MG tablet TAKE 1 TABLET BY MOUTH ONCE DAILY OR AS DIRECTED  35 tablet   3    BP 128/74  Pulse 59  Ht 5\' 9"  (1.753 m)  Wt 220 lb (99.791 kg)  BMI 32.49 kg/m2 General: NAD, overweight.  Neck: JVP 7 cm, no thyromegaly or thyroid nodule.  Lungs: Clear to auscultation bilaterally with normal respiratory effort. CV: Nondisplaced PMI.  Heart regular S1/S2, no S3/S4, no murmur.  Trace ankle edema with lower leg varicosities.  No carotid bruit.  Normal pedal pulses.  Abdomen: Soft, nontender, no hepatosplenomegaly, no distention.  Neurologic: Alert and oriented x 3.  Psych: Normal affect. Extremities: No clubbing or cyanosis.

## 2011-09-25 NOTE — Assessment & Plan Note (Signed)
Patient remains in NSR today.  He is on warfarin and Coreg.

## 2011-09-25 NOTE — Assessment & Plan Note (Signed)
Stable with no ischemic symptoms.  He can stop ASA as he has stable CAD and is also on warfarin.

## 2011-09-25 NOTE — Assessment & Plan Note (Signed)
LDL at goal (< 70) when recently checked.  

## 2011-09-25 NOTE — Patient Instructions (Addendum)
Stop aspirin.  Your physician wants you to follow-up in: 6 months with Dr Shirlee Latch. (January 2014).  You will receive a reminder letter in the mail two months in advance. If you don't receive a letter, please call our office to schedule the follow-up appointment.

## 2011-10-27 ENCOUNTER — Ambulatory Visit (INDEPENDENT_AMBULATORY_CARE_PROVIDER_SITE_OTHER): Payer: Medicare Other | Admitting: Pharmacist

## 2011-10-27 DIAGNOSIS — Z7901 Long term (current) use of anticoagulants: Secondary | ICD-10-CM | POA: Diagnosis not present

## 2011-10-27 DIAGNOSIS — I4891 Unspecified atrial fibrillation: Secondary | ICD-10-CM

## 2011-10-27 LAB — POCT INR: INR: 2.7

## 2011-11-02 ENCOUNTER — Other Ambulatory Visit: Payer: Self-pay | Admitting: Cardiology

## 2011-11-02 MED ORDER — CARVEDILOL 12.5 MG PO TABS: 12.5000 mg | ORAL_TABLET | Freq: Two times a day (BID) | ORAL | Status: DC

## 2011-11-02 NOTE — Telephone Encounter (Signed)
90 day supply

## 2011-11-03 ENCOUNTER — Other Ambulatory Visit: Payer: Self-pay | Admitting: Cardiology

## 2011-11-05 ENCOUNTER — Other Ambulatory Visit: Payer: Self-pay | Admitting: *Deleted

## 2011-11-05 MED ORDER — CARVEDILOL 12.5 MG PO TABS: 12.5000 mg | ORAL_TABLET | Freq: Two times a day (BID) | ORAL | Status: DC

## 2011-11-24 ENCOUNTER — Other Ambulatory Visit: Payer: Self-pay | Admitting: Cardiology

## 2011-12-08 ENCOUNTER — Ambulatory Visit (INDEPENDENT_AMBULATORY_CARE_PROVIDER_SITE_OTHER): Payer: Medicare Other | Admitting: Pharmacist

## 2011-12-08 DIAGNOSIS — Z7901 Long term (current) use of anticoagulants: Secondary | ICD-10-CM

## 2011-12-08 DIAGNOSIS — I4891 Unspecified atrial fibrillation: Secondary | ICD-10-CM | POA: Diagnosis not present

## 2012-01-19 ENCOUNTER — Ambulatory Visit (INDEPENDENT_AMBULATORY_CARE_PROVIDER_SITE_OTHER): Payer: Medicare Other | Admitting: *Deleted

## 2012-01-19 DIAGNOSIS — Z7901 Long term (current) use of anticoagulants: Secondary | ICD-10-CM | POA: Diagnosis not present

## 2012-01-19 DIAGNOSIS — I4891 Unspecified atrial fibrillation: Secondary | ICD-10-CM | POA: Diagnosis not present

## 2012-01-19 LAB — POCT INR: INR: 2.3

## 2012-01-27 DIAGNOSIS — Z23 Encounter for immunization: Secondary | ICD-10-CM | POA: Diagnosis not present

## 2012-02-29 ENCOUNTER — Other Ambulatory Visit: Payer: Self-pay | Admitting: Cardiology

## 2012-03-01 ENCOUNTER — Ambulatory Visit (INDEPENDENT_AMBULATORY_CARE_PROVIDER_SITE_OTHER): Payer: Medicare Other | Admitting: *Deleted

## 2012-03-01 DIAGNOSIS — I4891 Unspecified atrial fibrillation: Secondary | ICD-10-CM | POA: Diagnosis not present

## 2012-03-01 DIAGNOSIS — Z7901 Long term (current) use of anticoagulants: Secondary | ICD-10-CM | POA: Diagnosis not present

## 2012-03-03 ENCOUNTER — Encounter: Payer: Self-pay | Admitting: Cardiology

## 2012-03-03 DIAGNOSIS — I10 Essential (primary) hypertension: Secondary | ICD-10-CM | POA: Diagnosis not present

## 2012-03-03 DIAGNOSIS — E039 Hypothyroidism, unspecified: Secondary | ICD-10-CM | POA: Diagnosis not present

## 2012-03-03 DIAGNOSIS — I4891 Unspecified atrial fibrillation: Secondary | ICD-10-CM | POA: Diagnosis not present

## 2012-03-03 DIAGNOSIS — E785 Hyperlipidemia, unspecified: Secondary | ICD-10-CM | POA: Diagnosis not present

## 2012-03-03 DIAGNOSIS — Z79899 Other long term (current) drug therapy: Secondary | ICD-10-CM | POA: Diagnosis not present

## 2012-03-30 ENCOUNTER — Other Ambulatory Visit: Payer: Self-pay | Admitting: Cardiology

## 2012-04-12 ENCOUNTER — Ambulatory Visit (INDEPENDENT_AMBULATORY_CARE_PROVIDER_SITE_OTHER): Payer: Medicare Other | Admitting: *Deleted

## 2012-04-12 DIAGNOSIS — I4891 Unspecified atrial fibrillation: Secondary | ICD-10-CM

## 2012-04-12 DIAGNOSIS — Z7901 Long term (current) use of anticoagulants: Secondary | ICD-10-CM

## 2012-04-12 LAB — POCT INR: INR: 2.3

## 2012-04-13 ENCOUNTER — Other Ambulatory Visit: Payer: Self-pay | Admitting: Cardiology

## 2012-05-27 ENCOUNTER — Ambulatory Visit (INDEPENDENT_AMBULATORY_CARE_PROVIDER_SITE_OTHER): Payer: Medicare Other | Admitting: *Deleted

## 2012-05-27 DIAGNOSIS — I4891 Unspecified atrial fibrillation: Secondary | ICD-10-CM

## 2012-05-27 DIAGNOSIS — Z7901 Long term (current) use of anticoagulants: Secondary | ICD-10-CM | POA: Diagnosis not present

## 2012-05-27 LAB — POCT INR: INR: 2

## 2012-06-01 ENCOUNTER — Other Ambulatory Visit: Payer: Self-pay | Admitting: *Deleted

## 2012-06-01 MED ORDER — SIMVASTATIN 40 MG PO TABS: ORAL_TABLET | ORAL | Status: DC

## 2012-07-06 ENCOUNTER — Ambulatory Visit (INDEPENDENT_AMBULATORY_CARE_PROVIDER_SITE_OTHER): Payer: Medicare Other | Admitting: *Deleted

## 2012-07-06 DIAGNOSIS — Z7901 Long term (current) use of anticoagulants: Secondary | ICD-10-CM

## 2012-07-06 DIAGNOSIS — I4891 Unspecified atrial fibrillation: Secondary | ICD-10-CM

## 2012-07-06 LAB — POCT INR: INR: 2.1

## 2012-07-14 ENCOUNTER — Other Ambulatory Visit: Payer: Self-pay | Admitting: Emergency Medicine

## 2012-07-14 MED ORDER — NITROGLYCERIN 0.4 MG SL SUBL: 0.4000 mg | SUBLINGUAL_TABLET | SUBLINGUAL | Status: DC | PRN

## 2012-08-17 ENCOUNTER — Ambulatory Visit (INDEPENDENT_AMBULATORY_CARE_PROVIDER_SITE_OTHER): Payer: Medicare Other | Admitting: *Deleted

## 2012-08-17 DIAGNOSIS — I4891 Unspecified atrial fibrillation: Secondary | ICD-10-CM

## 2012-08-17 DIAGNOSIS — Z7901 Long term (current) use of anticoagulants: Secondary | ICD-10-CM

## 2012-09-03 ENCOUNTER — Other Ambulatory Visit: Payer: Self-pay | Admitting: Cardiology

## 2012-09-07 ENCOUNTER — Other Ambulatory Visit: Payer: Self-pay | Admitting: Cardiology

## 2012-09-14 ENCOUNTER — Ambulatory Visit (INDEPENDENT_AMBULATORY_CARE_PROVIDER_SITE_OTHER): Payer: Medicare Other | Admitting: *Deleted

## 2012-09-14 DIAGNOSIS — Z7901 Long term (current) use of anticoagulants: Secondary | ICD-10-CM

## 2012-09-14 DIAGNOSIS — I4891 Unspecified atrial fibrillation: Secondary | ICD-10-CM

## 2012-09-28 ENCOUNTER — Ambulatory Visit (INDEPENDENT_AMBULATORY_CARE_PROVIDER_SITE_OTHER): Payer: Medicare Other | Admitting: *Deleted

## 2012-09-28 DIAGNOSIS — I4891 Unspecified atrial fibrillation: Secondary | ICD-10-CM

## 2012-09-28 DIAGNOSIS — Z7901 Long term (current) use of anticoagulants: Secondary | ICD-10-CM | POA: Diagnosis not present

## 2012-09-28 LAB — POCT INR: INR: 2.8

## 2012-10-04 DIAGNOSIS — I10 Essential (primary) hypertension: Secondary | ICD-10-CM | POA: Diagnosis not present

## 2012-10-04 DIAGNOSIS — Z125 Encounter for screening for malignant neoplasm of prostate: Secondary | ICD-10-CM | POA: Diagnosis not present

## 2012-10-04 DIAGNOSIS — I4891 Unspecified atrial fibrillation: Secondary | ICD-10-CM | POA: Diagnosis not present

## 2012-10-04 DIAGNOSIS — L408 Other psoriasis: Secondary | ICD-10-CM | POA: Diagnosis not present

## 2012-10-04 DIAGNOSIS — E785 Hyperlipidemia, unspecified: Secondary | ICD-10-CM | POA: Diagnosis not present

## 2012-10-04 DIAGNOSIS — E039 Hypothyroidism, unspecified: Secondary | ICD-10-CM | POA: Diagnosis not present

## 2012-10-04 DIAGNOSIS — D539 Nutritional anemia, unspecified: Secondary | ICD-10-CM | POA: Diagnosis not present

## 2012-10-04 DIAGNOSIS — I2510 Atherosclerotic heart disease of native coronary artery without angina pectoris: Secondary | ICD-10-CM | POA: Diagnosis not present

## 2012-10-04 DIAGNOSIS — Z79899 Other long term (current) drug therapy: Secondary | ICD-10-CM | POA: Diagnosis not present

## 2012-10-12 DIAGNOSIS — D72819 Decreased white blood cell count, unspecified: Secondary | ICD-10-CM | POA: Diagnosis not present

## 2012-10-19 ENCOUNTER — Ambulatory Visit (INDEPENDENT_AMBULATORY_CARE_PROVIDER_SITE_OTHER): Payer: Medicare Other | Admitting: Pharmacist

## 2012-10-19 DIAGNOSIS — Z7901 Long term (current) use of anticoagulants: Secondary | ICD-10-CM | POA: Diagnosis not present

## 2012-10-19 DIAGNOSIS — I4891 Unspecified atrial fibrillation: Secondary | ICD-10-CM | POA: Diagnosis not present

## 2012-10-19 LAB — POCT INR: INR: 4.2

## 2012-10-27 ENCOUNTER — Ambulatory Visit (INDEPENDENT_AMBULATORY_CARE_PROVIDER_SITE_OTHER): Payer: Medicare Other | Admitting: Pharmacist

## 2012-10-27 DIAGNOSIS — Z7901 Long term (current) use of anticoagulants: Secondary | ICD-10-CM | POA: Diagnosis not present

## 2012-10-27 DIAGNOSIS — I4891 Unspecified atrial fibrillation: Secondary | ICD-10-CM

## 2012-10-27 LAB — POCT INR: INR: 2.3

## 2012-11-10 ENCOUNTER — Ambulatory Visit (INDEPENDENT_AMBULATORY_CARE_PROVIDER_SITE_OTHER): Payer: Medicare Other | Admitting: *Deleted

## 2012-11-10 DIAGNOSIS — I4891 Unspecified atrial fibrillation: Secondary | ICD-10-CM | POA: Diagnosis not present

## 2012-11-10 DIAGNOSIS — Z7901 Long term (current) use of anticoagulants: Secondary | ICD-10-CM | POA: Diagnosis not present

## 2012-12-06 ENCOUNTER — Ambulatory Visit (INDEPENDENT_AMBULATORY_CARE_PROVIDER_SITE_OTHER): Payer: Medicare Other | Admitting: Cardiology

## 2012-12-06 ENCOUNTER — Encounter: Payer: Self-pay | Admitting: Cardiology

## 2012-12-06 VITALS — BP 138/83 | HR 55 | Ht 70.0 in | Wt 222.0 lb

## 2012-12-06 DIAGNOSIS — E785 Hyperlipidemia, unspecified: Secondary | ICD-10-CM

## 2012-12-06 DIAGNOSIS — I2510 Atherosclerotic heart disease of native coronary artery without angina pectoris: Secondary | ICD-10-CM

## 2012-12-06 DIAGNOSIS — I4891 Unspecified atrial fibrillation: Secondary | ICD-10-CM

## 2012-12-06 MED ORDER — CARVEDILOL 12.5 MG PO TABS: 12.5000 mg | ORAL_TABLET | Freq: Two times a day (BID) | ORAL | Status: DC

## 2012-12-06 MED ORDER — NITROGLYCERIN 0.4 MG SL SUBL: 0.4000 mg | SUBLINGUAL_TABLET | SUBLINGUAL | Status: DC | PRN

## 2012-12-06 MED ORDER — SIMVASTATIN 40 MG PO TABS: ORAL_TABLET | ORAL | Status: DC

## 2012-12-06 NOTE — Patient Instructions (Addendum)
Your physician wants you to follow-up in: 1 year with Dr McLean. (September 2015)  You will receive a reminder letter in the mail two months in advance. If you don't receive a letter, please call our office to schedule the follow-up appointment.  

## 2012-12-07 NOTE — Progress Notes (Signed)
Patient ID: [name] [name], male   DOB: 1940/10/19, 72 y.o.   MRN: [id number] PCP: Dr. Sudie Bailey  72 yo with history of CAD s/p PCI in 2005 and paroxysmal atrial fibrillation presents for cardiology followup.  He is mildly short of breath walking up a hill but otherwise does ok.  He does yardwork with no dyspnea.  He is able to split wood without problems.  He has some generalized fatigue that is chronic.  No chest pain.  No orthopnea or PND.  No syncope.   No stroke-like symptoms.  No tachypalpitations.  He is on coumadin for PAF.    ECG: NSR, normal  Labs (10/12): K 4.2, creatinine 1.0, LDL 70, HDL 54, AST/ALT ok. Labs (6/13): K 4.1, creatinine 0.86, HCT 40.8, LDL 69, HDL 49 Labs (7/14): K 3.8, creatinine 0.84, HCT 40.5, LDL 81, HDL 51, TSH normal  PMH: 1. Gilberts syndrome 2. CAD: NSTEMI in 2005 with DES to mid RCA and ostial PLV ([facility]).  Myoview in 1/12 with no ischemia or infarction. Echo (12/12): EF 55-60%, moderate LVH, grade II diastolic dysfunction.  3. Hypothyroidism 4. HTN  5. Nephrolithiasis 6. Hyperlipidemia 7. Atrial fibrillation: First noted in 12/12.   SH: Married (Brenda Odenthal).  3 children.  Lives in [place].  Retired Art gallery manager.  Quit smoking in 1994.   FH: No premature CAD   Current Outpatient Prescriptions  Medication Sig Dispense Refill  . fish oil-omega-3 fatty acids 1000 MG capsule Take 1 g by mouth 2 (two) times daily.        . Glucosamine-Chondroit-Vit C-Mn (GLUCOSAMINE 1500 COMPLEX PO) Take 1 tablet by mouth 2 (two) times daily.       Marland Kitchen levothyroxine (SYNTHROID, LEVOTHROID) 50 MCG tablet Take 50 mcg by mouth daily.        . nitroGLYCERIN (NITROSTAT) 0.4 MG SL tablet Place 1 tablet (0.4 mg total) under the tongue every 5 (five) minutes as needed.  100 tablet  6  . simvastatin (ZOCOR) 40 MG tablet TAKE 1 TABLET BY MOUTH AT BEDTIME.  90 tablet  3  . warfarin (COUMADIN) 5 MG tablet TAKE AS DIRECTED BY COUMADIN CLINIC  35 tablet  3  . carvedilol (COREG) 12.5  MG tablet Take 1 tablet (12.5 mg total) by mouth 2 (two) times daily.  180 tablet  3   No current facility-administered medications for this visit.    BP 138/83  Pulse 55  Ht 5\' 10"  (1.778 m)  Wt 100.699 kg (222 lb)  BMI 31.85 kg/m2 General: NAD, overweight.  Neck: JVP 7 cm, no thyromegaly or thyroid nodule.  Lungs: Clear to auscultation bilaterally with normal respiratory effort. CV: Nondisplaced PMI.  Heart regular S1/S2, no S3/S4, no murmur.  Trace ankle edema with lower leg varicosities.  No carotid bruit.  Normal pedal pulses.  Abdomen: Soft, nontender, no hepatosplenomegaly, no distention.  Neurologic: Alert and oriented x 3.  Psych: Normal affect. Extremities: No clubbing or cyanosis.   Assessment/Plan: 1. Atrial fibrillation: Paroxysmal.  NSR today.  No symptomatic recurrence.  We talked about NOAC, he wants to continue warfarin, has not had any problems with it. 2. CAD: Stable, no ischemic symptoms.  Not on aspirin as he is on warfarin.  Continue statin, Coreg.  3. Hyperlipidemia: Lipids ok in 7/14. Continue statin.   Followup in 1 year.   Marca Ancona 12/07/2012 9:16 AM

## 2012-12-09 ENCOUNTER — Ambulatory Visit (INDEPENDENT_AMBULATORY_CARE_PROVIDER_SITE_OTHER): Payer: Medicare Other

## 2012-12-09 DIAGNOSIS — Z7901 Long term (current) use of anticoagulants: Secondary | ICD-10-CM

## 2012-12-09 DIAGNOSIS — I4891 Unspecified atrial fibrillation: Secondary | ICD-10-CM

## 2012-12-09 MED ORDER — WARFARIN SODIUM 5 MG PO TABS: ORAL_TABLET | ORAL | Status: DC

## 2012-12-27 ENCOUNTER — Ambulatory Visit: Payer: Medicare Other | Admitting: Cardiology

## 2013-01-06 ENCOUNTER — Ambulatory Visit (INDEPENDENT_AMBULATORY_CARE_PROVIDER_SITE_OTHER): Payer: Medicare Other | Admitting: General Practice

## 2013-01-06 DIAGNOSIS — Z7901 Long term (current) use of anticoagulants: Secondary | ICD-10-CM | POA: Diagnosis not present

## 2013-01-06 DIAGNOSIS — I4891 Unspecified atrial fibrillation: Secondary | ICD-10-CM | POA: Diagnosis not present

## 2013-01-06 DIAGNOSIS — Z5181 Encounter for therapeutic drug level monitoring: Secondary | ICD-10-CM

## 2013-02-21 ENCOUNTER — Ambulatory Visit (INDEPENDENT_AMBULATORY_CARE_PROVIDER_SITE_OTHER): Payer: Medicare Other | Admitting: Pharmacist

## 2013-02-21 DIAGNOSIS — I4891 Unspecified atrial fibrillation: Secondary | ICD-10-CM

## 2013-02-21 DIAGNOSIS — Z7901 Long term (current) use of anticoagulants: Secondary | ICD-10-CM | POA: Diagnosis not present

## 2013-02-21 LAB — POCT INR: INR: 2.1

## 2013-03-08 ENCOUNTER — Other Ambulatory Visit: Payer: Self-pay | Admitting: Cardiology

## 2013-03-21 ENCOUNTER — Ambulatory Visit (INDEPENDENT_AMBULATORY_CARE_PROVIDER_SITE_OTHER): Payer: Medicare Other | Admitting: Pharmacist

## 2013-03-21 DIAGNOSIS — Z5181 Encounter for therapeutic drug level monitoring: Secondary | ICD-10-CM

## 2013-03-21 DIAGNOSIS — I4891 Unspecified atrial fibrillation: Secondary | ICD-10-CM

## 2013-03-21 DIAGNOSIS — Z7901 Long term (current) use of anticoagulants: Secondary | ICD-10-CM

## 2013-03-21 LAB — POCT INR: INR: 1.5

## 2013-04-06 ENCOUNTER — Encounter: Payer: Self-pay | Admitting: Cardiology

## 2013-04-06 DIAGNOSIS — I10 Essential (primary) hypertension: Secondary | ICD-10-CM | POA: Diagnosis not present

## 2013-04-06 DIAGNOSIS — E785 Hyperlipidemia, unspecified: Secondary | ICD-10-CM | POA: Diagnosis not present

## 2013-04-06 DIAGNOSIS — E039 Hypothyroidism, unspecified: Secondary | ICD-10-CM | POA: Diagnosis not present

## 2013-04-06 DIAGNOSIS — I2510 Atherosclerotic heart disease of native coronary artery without angina pectoris: Secondary | ICD-10-CM | POA: Diagnosis not present

## 2013-04-06 DIAGNOSIS — Z79899 Other long term (current) drug therapy: Secondary | ICD-10-CM | POA: Diagnosis not present

## 2013-04-06 DIAGNOSIS — L408 Other psoriasis: Secondary | ICD-10-CM | POA: Diagnosis not present

## 2013-04-06 DIAGNOSIS — I4891 Unspecified atrial fibrillation: Secondary | ICD-10-CM | POA: Diagnosis not present

## 2013-04-11 ENCOUNTER — Ambulatory Visit (INDEPENDENT_AMBULATORY_CARE_PROVIDER_SITE_OTHER): Payer: Medicare Other | Admitting: *Deleted

## 2013-04-11 DIAGNOSIS — Z5181 Encounter for therapeutic drug level monitoring: Secondary | ICD-10-CM

## 2013-04-11 DIAGNOSIS — I4891 Unspecified atrial fibrillation: Secondary | ICD-10-CM | POA: Diagnosis not present

## 2013-04-11 DIAGNOSIS — Z7901 Long term (current) use of anticoagulants: Secondary | ICD-10-CM | POA: Diagnosis not present

## 2013-04-11 LAB — POCT INR: INR: 2.1

## 2013-05-09 ENCOUNTER — Ambulatory Visit (INDEPENDENT_AMBULATORY_CARE_PROVIDER_SITE_OTHER): Payer: Medicare Other | Admitting: *Deleted

## 2013-05-09 DIAGNOSIS — I4891 Unspecified atrial fibrillation: Secondary | ICD-10-CM

## 2013-05-09 DIAGNOSIS — Z7901 Long term (current) use of anticoagulants: Secondary | ICD-10-CM | POA: Diagnosis not present

## 2013-05-09 DIAGNOSIS — Z5181 Encounter for therapeutic drug level monitoring: Secondary | ICD-10-CM | POA: Diagnosis not present

## 2013-05-09 LAB — POCT INR: INR: 2.2

## 2013-06-06 ENCOUNTER — Ambulatory Visit (INDEPENDENT_AMBULATORY_CARE_PROVIDER_SITE_OTHER): Payer: Medicare Other | Admitting: Pharmacist Clinician (PhC)/ Clinical Pharmacy Specialist

## 2013-06-06 DIAGNOSIS — I4891 Unspecified atrial fibrillation: Secondary | ICD-10-CM | POA: Diagnosis not present

## 2013-06-06 DIAGNOSIS — Z7901 Long term (current) use of anticoagulants: Secondary | ICD-10-CM

## 2013-06-06 DIAGNOSIS — Z5181 Encounter for therapeutic drug level monitoring: Secondary | ICD-10-CM | POA: Diagnosis not present

## 2013-06-06 LAB — POCT INR: INR: 2.1

## 2013-07-12 ENCOUNTER — Ambulatory Visit (INDEPENDENT_AMBULATORY_CARE_PROVIDER_SITE_OTHER): Payer: Medicare Other | Admitting: *Deleted

## 2013-07-12 ENCOUNTER — Encounter (INDEPENDENT_AMBULATORY_CARE_PROVIDER_SITE_OTHER): Payer: Self-pay

## 2013-07-12 DIAGNOSIS — I4891 Unspecified atrial fibrillation: Secondary | ICD-10-CM

## 2013-07-12 DIAGNOSIS — Z7901 Long term (current) use of anticoagulants: Secondary | ICD-10-CM

## 2013-07-12 DIAGNOSIS — Z5181 Encounter for therapeutic drug level monitoring: Secondary | ICD-10-CM | POA: Diagnosis not present

## 2013-07-12 LAB — POCT INR: INR: 1.7

## 2013-07-26 ENCOUNTER — Telehealth: Payer: Self-pay | Admitting: Hematology and Oncology

## 2013-07-26 NOTE — Telephone Encounter (Signed)
C/D 07/26/13 for appt. 08/14/13

## 2013-07-26 NOTE — Telephone Encounter (Signed)
S/W PT IN REF TO NP APPT ON 08/14/13@1 :00 REFERRING DR PROCHNAU DX-HX OF SKIN CA ON FACE NO NEED TO MAIL NP PACKET-PT'S WIFE COMES HERE

## 2013-08-02 ENCOUNTER — Ambulatory Visit (INDEPENDENT_AMBULATORY_CARE_PROVIDER_SITE_OTHER): Payer: Medicare Other | Admitting: *Deleted

## 2013-08-02 DIAGNOSIS — Z7901 Long term (current) use of anticoagulants: Secondary | ICD-10-CM

## 2013-08-02 DIAGNOSIS — I4891 Unspecified atrial fibrillation: Secondary | ICD-10-CM | POA: Diagnosis not present

## 2013-08-02 DIAGNOSIS — Z5181 Encounter for therapeutic drug level monitoring: Secondary | ICD-10-CM

## 2013-08-02 LAB — POCT INR: INR: 2.2

## 2013-08-14 ENCOUNTER — Telehealth: Payer: Self-pay | Admitting: Hematology and Oncology

## 2013-08-14 ENCOUNTER — Ambulatory Visit: Payer: Medicare Other

## 2013-08-14 ENCOUNTER — Ambulatory Visit (HOSPITAL_BASED_OUTPATIENT_CLINIC_OR_DEPARTMENT_OTHER): Payer: Medicare Other | Admitting: Hematology and Oncology

## 2013-08-14 ENCOUNTER — Encounter: Payer: Self-pay | Admitting: Hematology and Oncology

## 2013-08-14 VITALS — BP 150/81 | HR 58 | Temp 98.1°F | Resp 18 | Ht 69.0 in | Wt 218.3 lb

## 2013-08-14 DIAGNOSIS — K130 Diseases of lips: Secondary | ICD-10-CM | POA: Diagnosis not present

## 2013-08-14 DIAGNOSIS — K089 Disorder of teeth and supporting structures, unspecified: Secondary | ICD-10-CM

## 2013-08-14 DIAGNOSIS — C4490 Unspecified malignant neoplasm of skin, unspecified: Secondary | ICD-10-CM | POA: Diagnosis not present

## 2013-08-14 NOTE — Telephone Encounter (Signed)
s.w. Lattie Haw @ Dr. Lawana Chambers office and she will call pt with appt once Dr. reviews pt info

## 2013-08-14 NOTE — Progress Notes (Signed)
[facility] NOTE  Patient Care Team: Ernestene Kiel, MD as PCP - General (Internal Medicine)  CHIEF COMPLAINTS/PURPOSE OF CONSULTATION:  Chronic nonhealing sore on his upper lip, concerned for malignancy  HISTORY OF PRESENTING ILLNESS:  [name] [name] 73 y.o. male is here because of her neck nonhealing sore on his upper lip. The patient had a history of psoriasis and was treated with radiation to his face when he was younger. He also have a background history of skin cancer on his face, status post Mohs procedure in 2012. His started to notice a nonhealing lip sores/ulcer for approximately 9 months and it has grown in size since. It is not causing pain or difficulties with eating. Sometimes, acidic food seems to irritate it more. He never noticed bleeding from the lesion. The patient does not wear dentures but has very poor dentition and has not seen a dentist for a long time due to lack of dental insurance.  MEDICAL HISTORY:  Past Medical History  Diagnosis Date  . Hypothyroidism   . CAD (coronary artery disease)   . HLD (hyperlipidemia)   . HTN (hypertension)   . Myocardial infarction, nontransmural, subendocardial 2005  . Atrial fibrillation   . Psoriasis   . (HFpEF) heart failure with preserved ejection fraction     SURGICAL HISTORY: Past Surgical History  Procedure Laterality Date  . Hernia repair  2008  . Skin cancer excision  2009    upper lip  . Tibia fracture surgery  1982    right  . Kidney stone surgery  1987  . Cardiac catheterization  2005    RCA: 99% mid. RPL: 70% ostial,   . Coronary angioplasty with stent placement  2005    RCA and RPL (DES)    SOCIAL HISTORY: History   Social History  . Marital Status: Married    Spouse Name: N/A    Number of Children: 3  . Years of Education: N/A   Occupational History  . retired    Social History Main Topics  . Smoking status: Former Smoker -- 0.30 packs/day for 30 years    Quit  date: 01/14/1993  . Smokeless tobacco: Never Used  . Alcohol Use: 1.2 oz/week    2 Cans of beer per week  . Drug Use: No  . Sexual Activity: Not on file   Other Topics Concern  . Not on file   Social History Narrative  . No narrative on file    FAMILY HISTORY: Family History  Problem Relation Age of Onset  . Heart failure      ALLERGIES:  has No Known Allergies.  MEDICATIONS:  Current Outpatient Prescriptions  Medication Sig Dispense Refill  . carvedilol (COREG) 12.5 MG tablet Take 1 tablet (12.5 mg total) by mouth 2 (two) times daily.  180 tablet  3  . Glucosamine-Chondroit-Vit C-Mn (GLUCOSAMINE 1500 COMPLEX PO) Take 1 tablet by mouth 2 (two) times daily.       Marland Kitchen levothyroxine (SYNTHROID, LEVOTHROID) 50 MCG tablet Take 50 mcg by mouth daily.        . nitroGLYCERIN (NITROSTAT) 0.4 MG SL tablet Place 1 tablet (0.4 mg total) under the tongue every 5 (five) minutes as needed.  100 tablet  6  . simvastatin (ZOCOR) 40 MG tablet TAKE 1 TABLET BY MOUTH AT BEDTIME.  90 tablet  3  . warfarin (COUMADIN) 5 MG tablet TAKE AS DIRECTED BY COUMADIN CLINIC  35 tablet  3   No current facility-administered medications for  this visit.    REVIEW OF SYSTEMS:   Constitutional: Denies fevers, chills or abnormal night sweats Eyes: Denies blurriness of vision, double vision or watery eyes Ears, nose, mouth, throat, and face: Denies mucositis or sore throat Respiratory: Denies cough, dyspnea or wheezes Cardiovascular: Denies palpitation, chest discomfort or lower extremity swelling Gastrointestinal:  Denies nausea, heartburn or change in bowel habits Lymphatics: Denies new lymphadenopathy or easy bruising Neurological:Denies numbness, tingling or new weaknesses Behavioral/Psych: Mood is stable, no new changes  All other systems were reviewed with the patient and are negative.  PHYSICAL EXAMINATION: ECOG PERFORMANCE STATUS: 0 - Asymptomatic  Filed Vitals:   08/14/13 1235  BP: 150/81  Pulse:  58  Temp: 98.1 F (36.7 C)  Resp: 18   Filed Weights   08/14/13 1235  Weight: 218 lb 4.8 oz (99.02 kg)    GENERAL:alert, no distress and comfortable SKIN: Significant dryness on his face is noted. He has significant psoriatic plaques in his legs, scalp and face EYES: normal, conjunctiva are pink and non-injected, sclera clear OROPHARYNX:no exudate, no erythema, buccal mucosa, and tongue normal. He has very poor dentition. Noted a fleshy looking lesion on his upper portion of the upper lip. NECK: supple, thyroid normal size, non-tender, without nodularity LYMPH:  no palpable lymphadenopathy in the cervical, axillary or inguinal LUNGS: clear to auscultation and percussion with normal breathing effort HEART: regular rate & rhythm and no murmurs and no lower extremity edema ABDOMEN:abdomen soft, non-tender and normal bowel sounds Musculoskeletal:no cyanosis of digits and no clubbing  PSYCH: alert & oriented x 3 with fluent speech NEURO: no focal motor/sensory deficits  LABORATORY DATA:  I have reviewed the data as listed Lab Results  Component Value Date   WBC 4.8 02/17/2011   HGB 13.6 02/17/2011   HCT 39.3 02/17/2011   MCV 96.6 02/17/2011   PLT 208.0 02/17/2011   No results found for this basename: NA, K, CL, CO2, GLUCOSE, BUN, CREATININE, CALCIUM, GFRNONAA, GFRAA, PROT, ALBUMIN, AST, ALT, ALKPHOS, BILITOT, BILIDIR, IBILI,  in the last 8760 hours  ASSESSMENT & PLAN:  Lip lesion Overall, it does not not appeared to be malignant. I recommend referral to a dentist for dental extraction as I believe the broken tooth in the upper incisor is responsible for causing chronic sores/ulceration on the lip. Nevertheless, the patient is high risk of getting cancer due to prior radiation exposure. I have communicated a message to the dentist to proceed biopsy of the lip lesions if possible to exclude cancer.  Poor dentition Referral to the the dentist is made. The patient would require extensive  extraction of his teeth.  #3 skin cancer status post Mohs procedure I recommend he continues followup with dermatologist  #4 psoriasis The patient does not want to start any treatment as he does not believe they are helpful to control his symptoms.   Orders Placed This Encounter  Procedures  . Ambulatory referral to Dentistry    Referral Priority:  Routine    Referral Type:  Consultation    Referral Reason:  Specialty Services Required    Requested Specialty:  Dental General Practice    Number of Visits Requested:  1    All questions were answered. The patient knows to call the clinic with any problems, questions or concerns. I spent 40 minutes counseling the patient face to face. The total time spent in the appointment was 55 minutes and more than 50% was on counseling.     Heath Lark, MD 08/14/2013 11:13  PM

## 2013-08-14 NOTE — Assessment & Plan Note (Signed)
Referral to the the dentist is made. The patient would require extensive extraction of his teeth.

## 2013-08-14 NOTE — Assessment & Plan Note (Signed)
Overall, it does not not appeared to be malignant. I recommend referral to a dentist for dental extraction as I believe the broken tooth in the upper incisor is responsible for causing chronic sores/ulceration on the lip. Nevertheless, the patient is high risk of getting cancer due to prior radiation exposure. I have communicated a message to the dentist to proceed biopsy of the lip lesions if possible to exclude cancer.

## 2013-08-17 ENCOUNTER — Ambulatory Visit (HOSPITAL_COMMUNITY): Payer: Self-pay | Admitting: Dentistry

## 2013-08-17 ENCOUNTER — Encounter (HOSPITAL_COMMUNITY): Payer: Self-pay | Admitting: Dentistry

## 2013-08-17 ENCOUNTER — Encounter (INDEPENDENT_AMBULATORY_CARE_PROVIDER_SITE_OTHER): Payer: Self-pay

## 2013-08-17 VITALS — BP 145/86 | HR 51 | Temp 98.3°F

## 2013-08-17 DIAGNOSIS — I4891 Unspecified atrial fibrillation: Secondary | ICD-10-CM

## 2013-08-17 DIAGNOSIS — K083 Retained dental root: Secondary | ICD-10-CM

## 2013-08-17 DIAGNOSIS — K0530 Chronic periodontitis, unspecified: Secondary | ICD-10-CM

## 2013-08-17 DIAGNOSIS — K030 Excessive attrition of teeth: Secondary | ICD-10-CM

## 2013-08-17 DIAGNOSIS — Z85819 Personal history of malignant neoplasm of unspecified site of lip, oral cavity, and pharynx: Secondary | ICD-10-CM

## 2013-08-17 DIAGNOSIS — M264 Malocclusion, unspecified: Secondary | ICD-10-CM

## 2013-08-17 DIAGNOSIS — Z7901 Long term (current) use of anticoagulants: Secondary | ICD-10-CM

## 2013-08-17 DIAGNOSIS — K011 Impacted teeth: Secondary | ICD-10-CM

## 2013-08-17 DIAGNOSIS — K08109 Complete loss of teeth, unspecified cause, unspecified class: Secondary | ICD-10-CM

## 2013-08-17 DIAGNOSIS — IMO0002 Reserved for concepts with insufficient information to code with codable children: Secondary | ICD-10-CM

## 2013-08-17 DIAGNOSIS — K130 Diseases of lips: Secondary | ICD-10-CM

## 2013-08-17 DIAGNOSIS — K045 Chronic apical periodontitis: Secondary | ICD-10-CM

## 2013-08-17 DIAGNOSIS — K036 Deposits [accretions] on teeth: Secondary | ICD-10-CM

## 2013-08-17 DIAGNOSIS — K029 Dental caries, unspecified: Secondary | ICD-10-CM

## 2013-08-17 NOTE — Progress Notes (Signed)
DENTAL CONSULTATION  Date of Consultation:  08/17/2013 Patient Name:   [name] [name] Date of Birth:   12-27-1940 Medical Record Number: [id number]  VITALS: BP 145/86  Pulse 51  Temp(Src) 98.3 F (36.8 C) (Oral)   CHIEF COMPLAINT: Patient referred for evaluation of upper right lip lesion and poor dentition.  HPI: [name] [name] is a 73 year old male referred by Dr. Alvy Bimler for evaluation of poor dentition and upper right lip lesion.  The patient has a history of a nonhealing ulceration involving his upper lip. This has been present off and on for the past 9 months. Patient indicates that the presence of the ulceration has been persistent over the last 6 weeks. The patient also has a history of broken teeth in the maxillary anterior area that may be contributing to the trauma to the upper lip. The patient also has a history of basal cell carcinoma involving the right upper lip/nares in approximately 2012.  Patient was referred by Dr. Alvy Bimler for possible biopsy to rule out malignancy and for treatment of his poor dentition.  The patient currently denies acute toothache, swellings, or abscesses. The patient was last seen by a dentist in [place] approximately 18-19 years ago.  Patient moved to [facility] and 1998 and has not seen a dentist since he has been living in [place].  Patient denies having any partial dentures.   PROBLEM LIST: Patient Active Problem List   Diagnosis Date Noted  . Lip lesion 08/14/2013  . Poor dentition 08/14/2013  . Encounter for therapeutic drug monitoring 04/11/2013  . Encounter for long-term (current) use of anticoagulants 02/23/2011  . Atrial fibrillation 02/17/2011  . CAD (coronary artery disease)   . HLD (hyperlipidemia)   . HTN (hypertension)     PMH: Past Medical History  Diagnosis Date  . Hypothyroidism   . CAD (coronary artery disease)   . HLD (hyperlipidemia)   . HTN (hypertension)   . Myocardial infarction, nontransmural,  subendocardial 2005  . Atrial fibrillation     Paroxysmal  . Psoriasis   . (HFpEF) heart failure with preserved ejection fraction   . Kidney stones     PSH: Past Surgical History  Procedure Laterality Date  . Hernia repair  2008  . Skin cancer excision  2009    right upper lip/nares  . Tibia fracture surgery  1982    right  . Kidney stone surgery  1987  . Cardiac catheterization  2005    RCA: 99% mid. RPL: 70% ostial,   . Coronary angioplasty with stent placement  2005    RCA and RPL (DES)    ALLERGIES: No Known Allergies  MEDICATIONS: Current Outpatient Prescriptions  Medication Sig Dispense Refill  . carvedilol (COREG) 12.5 MG tablet Take 1 tablet (12.5 mg total) by mouth 2 (two) times daily.  180 tablet  3  . Glucosamine-Chondroit-Vit C-Mn (GLUCOSAMINE 1500 COMPLEX PO) Take 1 tablet by mouth 2 (two) times daily.       Marland Kitchen levothyroxine (SYNTHROID, LEVOTHROID) 50 MCG tablet Take 50 mcg by mouth daily.        . nitroGLYCERIN (NITROSTAT) 0.4 MG SL tablet Place 1 tablet (0.4 mg total) under the tongue every 5 (five) minutes as needed.  100 tablet  6  . simvastatin (ZOCOR) 40 MG tablet TAKE 1 TABLET BY MOUTH AT BEDTIME.  90 tablet  3  . warfarin (COUMADIN) 5 MG tablet TAKE AS DIRECTED BY COUMADIN CLINIC  35 tablet  3   No current facility-administered medications for  this visit.    LABS: Lab Results  Component Value Date   WBC 4.8 02/17/2011   HGB 13.6 02/17/2011   HCT 39.3 02/17/2011   MCV 96.6 02/17/2011   PLT 208.0 02/17/2011   No results found for this basename: na, k, cl, co2, glucose, bun, creatinine, calcium, gfrnonaa, gfraa   Lab Results  Component Value Date   INR 2.2 08/02/2013   INR 1.7 07/12/2013   INR 2.1 06/06/2013   No results found for this basename: PTT    SOCIAL HISTORY: History   Social History  . Marital Status: Married    Spouse Name: N/A    Number of Children: 3  . Years of Education: N/A   Occupational History  . retired    Social  History Main Topics  . Smoking status: Former Smoker -- 0.30 packs/day for 30 years    Quit date: 01/14/1993  . Smokeless tobacco: Never Used  . Alcohol Use: 1.2 oz/week    2 Cans of beer per week  . Drug Use: No  . Sexual Activity: Not on file   Other Topics Concern  . Not on file   Social History Narrative  . No narrative on file    FAMILY HISTORY: Family History  Problem Relation Age of Onset  . Heart failure       REVIEW OF SYSTEMS: Reviewed with the patient and included in the dental record.  DENTAL HISTORY: CHIEF COMPLAINT: Patient referred for evaluation of upper right lip lesion and poor dentition.  HPI: [name] [name] is a 73 year old male referred by Dr. Alvy Bimler for evaluation of poor dentition and upper right lip lesion.  The patient has a history of a nonhealing ulceration involving his upper lip. This has been present off and on for the past 9 months. Patient indicates that the presence of the ulceration has been persistent over the last 6 weeks. The patient also has a history of broken teeth in the maxillary anterior area that may be contributing to the trauma to the upper lip. The patient also has a history of basal cell carcinoma involving the right upper lip/nares in approximately 2012.  Patient was referred by Dr. Alvy Bimler for possible biopsy to rule out malignancy and for treatment of his poor dentition.  The patient currently denies acute toothache, swellings, or abscesses. The patient was last seen by a dentist in [place] approximately 18-19 years ago.  Patient moved to [facility] and 1998 and has not seen a dentist since he has been living in [place].  Patient denies having any partial dentures.   DENTAL EXAMINATION:  GENERAL: The patient is a well-developed, well-nourished male in no acute distress. HEAD AND NECK: There is no submandibular lymphadenopathy. The patient has bilateral TMJ crepitus but denies acute TMJ symptoms. The patient has  generalized erythema of his face is consistent with psoriasis diagnosis by patient report. The patient has scar formation involving the face involving the right inferior nares area consistent with previous removal of basal cell carcinoma. The patient has a 6 x 8 mm ulceration involving the right upper inner lip that approximates the area of tooth #8-9. This is consistent with trauma to the lip from the incisal edge of tooth #9. INTRAORAL EXAM: The patient has normal saliva. There is no evidence of intraoral abscess formation. Patient has facial exostoses in the area of #10, 11-13, and 14-15 area. DENTITION: The patient is missing tooth numbers 1, 17, 31, and 32. The patient has an impacted tooth #  16. There are multiple retained root segments in the area of tooth numbers 2, 3, 4, 5, 8, 10, 12, 13, 14, 18, 19. PERIODONTAL: Patient has chronic periodontitis with plaque and calculus accumulations, selective areas of gingival recession, and some tooth mobility. There is incipient to moderate bone loss noted. DENTAL CARIES/SUBOPTIMAL RESTORATIONS: Patient has multiple dental caries noted as per dental charting form. ENDODONTIC: Patient currently denies acute pulpitis symptoms. The patient has periapical pathology associated with tooth numbers 8, 10, 12, 13, 18, 19, and 31. CROWN AND BRIDGE: Patient has crowns on tooth numbers 29 and 30 that appear to be acceptable. PROSTHODONTIC: Patient denies having any partial dentures. OCCLUSION: Patient has a poor occlusal scheme secondary to multiple missing teeth, multiple retained root segments, supra-eruption and drifting of the unopposed teeth into the edentulous areas, and presence of a deep overbite, and lack of replacement of missing teeth with dental prostheses.  RADIOGRAPHIC INTERPRETATION: An orthopantogram was taken on 08/17/2013 and supplemented with 14 periapical radiographs. There are multiple missing teeth. There are multiple retained root segments. There  are multiple areas of periapical pathology and radiolucency. Multiple dental caries are noted. There is incipient to moderate bone loss noted. There is supra-eruption and drifting of the unopposed teeth into the edentulous areas. There is an impacted tooth #16.  ASSESSMENTS: 1. Paroxysmal atrial fibrillation 2. Chronic Coumadin therapy 3. Psoriasis 4. 6 x 8 mm ulcerative lesion involving the upper right inner lip 5. Chronic apical periodontitis 6. Multiple dental caries 7. Multiple retained root segments 8. Chronic periodontitis with bone loss 9. Gingival recession 10. Incipient tooth mobility 11. Multiple missing teeth 12. Deep overbite 13. Generalized excessive mandibular anterior attrition 14. Poor occlusal scheme and malocclusion 15. Current Coumadin therapy with risk for bleeding with invasive dental procedures and biopsy 16. Impacted tooth #16.  PLAN/RECOMMENDATIONS: 1. I discussed the risks, benefits, and complications of various treatment options with the patient in relationship to his medical and dental conditions, chronic anticoagulation, and cardiovascular compromise. We discussed various treatment options to include no treatment, multiple extractions with alveoloplasty, pre-prosthetic surgery as indicated, periodontal therapy, dental restorations, root canal therapy, crown and bridge therapy, implant therapy, and replacement of missing teeth as indicated. We also discussed referral to Dr. Constance Holster (ENT) for evaluation of upper right lip ulceration for possible biopsy.  We also discussed referral to an oral surgeon for the dental extraction procedures. The patient currently wishes to proceed with referral to Dr. Constance Holster for evaluation for biopsy of the upper lip lesion. The patient will then consider multiple dental extraction procedures with alveoloplasty with either dental medicine in the operating room or with an oral surgeon at their offices.  After appropriate dental extractions, the  patient will follow up the general dentist of his choice for fabrication of an upper complete denture and possible lower partial denture if needed.  2. Discussion of findings with medical team and coordination of future medical and dental care as needed.      A. Dr. Loralie Champagne was contacted and felt that discontinuation of Coumadin therapy was acceptable prior to the anticipated lip biopsy and to the anticipated dental extraction procedures as needed. No bridging with Lovenox was         recommended at this time.     B. Dr. Lucien Mons was contacted and agreed to accept the referral for evaluation for the upper lip biopsy.  I spent 75 minutes face to face with patient and more than 50% of time was spent  in counseling and /or coordination of care.   Lenn Cal, DDS

## 2013-08-17 NOTE — Patient Instructions (Signed)
Patient will be referred to Dr. Constance Holster for evaluation for lip biopsy. The patient will then decide if he wishes to proceed with multiple extractions with alveoloplasty and pre-prosthetic surgery as indicated in the operating room with general anesthesia with dental medicine or be referred to an oral surgeon for the dental extraction procedures. Dr. Enrique Sack

## 2013-08-18 DIAGNOSIS — Z85828 Personal history of other malignant neoplasm of skin: Secondary | ICD-10-CM | POA: Diagnosis not present

## 2013-08-18 DIAGNOSIS — K130 Diseases of lips: Secondary | ICD-10-CM | POA: Diagnosis not present

## 2013-08-30 ENCOUNTER — Ambulatory Visit (INDEPENDENT_AMBULATORY_CARE_PROVIDER_SITE_OTHER): Payer: Medicare Other | Admitting: *Deleted

## 2013-08-30 DIAGNOSIS — Z7901 Long term (current) use of anticoagulants: Secondary | ICD-10-CM

## 2013-08-30 DIAGNOSIS — Z5181 Encounter for therapeutic drug level monitoring: Secondary | ICD-10-CM | POA: Diagnosis not present

## 2013-08-30 DIAGNOSIS — I4891 Unspecified atrial fibrillation: Secondary | ICD-10-CM

## 2013-08-30 LAB — POCT INR: INR: 2.2

## 2013-09-12 ENCOUNTER — Encounter (HOSPITAL_COMMUNITY): Payer: Self-pay | Admitting: Pharmacy Technician

## 2013-09-13 DIAGNOSIS — C4401 Basal cell carcinoma of skin of lip: Secondary | ICD-10-CM

## 2013-09-13 HISTORY — DX: Basal cell carcinoma of skin of lip: C44.01

## 2013-09-13 HISTORY — PX: BASAL CELL CARCINOMA EXCISION: SHX1214

## 2013-09-14 NOTE — H&P (Signed)
Assessment  Atrial fibrillation (427.31) (I48.91). Anticoagulated by anticoagulation treatment (V58.61) (Z79.01). History of basal cell carcinoma of skin (V10.83) (Z85.828). Lip mass (528.5) (K13.0). Discussed  History of basal cell cancer of the upper lip twice. New lesion, Inside the mucosal surface, very likely recurrent basal cell cancer. Recommend excisional biopsy with frozen section. He will need to be off Coumadin first. We will talk with his cardiologist. We can do this at the outpatient center with local anesthesia using MAC. Reason For Visit  [name] [name] is here today at the kind request of Teena Dunk for consultation and opinionfor mouth sore. HPI  Right upper lip mucosal lesion, he thought it was a cancor sore at first but it has been there for a few months. He has a history of basal scope cell cancer on the external skin of the upper lip twice in the past. He has had MOHS surgery both times. The last time was about 3 years ago. He needs extensive dental work done as well. History of external radiation many years ago for psoriasis. Allergies  No Known Drug Allergy. Current Meds  Nitrostat 0.4 MG Sublingual Tablet Sublingual;PLACE 1 TABLET UNDER THE TONGUE EVERY 5 MINUTES FOR UP TO 3 DOSES AS NEEDED FOR CHEST PAIN.CALL 911 IF PAIN PERSISTS.; RPT Zocor 20 MG Oral Tablet (Simvastatin);TAKE 1 TABLET DAILY AT BEDTIME.; RPT Coreg 12.5 MG Oral Tablet (Carvedilol);TAKE 1 TABLET TWICE DAILY WITH MEALS.; RPT Warfarin Sodium TABS;; RPT EpiPen 0.3 MG/0.3ML (1:1000) DEVI;inject intramuscularly if needed for LIFE-THREATNING ALLERGIC REACTION; RPT Glucosamine Chondr 1500 Complx CAPS;TAKE 1 CAPSULE TWICE DAILY; RPT. Active Problems  Atrial fibrillation   (427.31) (I48.91) Calculus of kidney   (592.0) (N20.0) Coronary artery disease   (414.00) (I25.10); PCI and DES to RCA 2005 Essential hypertension   (401.9) (I10) Hyperlipemia   (272.4) (E78.5) Nontoxic single thyroid nodule    (241.0) (E04.1); 6-8 mm on right. PMH  Arthritis (V13.4) Epilepsy And Recurrent Seizures (345.90) History of depression (V11.8) (Z86.59) History of malignant neoplasm of skin (V10.83) (Z36.644) History of myocardial infarction (412) (I25.2) Malignant neoplasm without specification of site (199.1) (C80.1); lip Nephrolithiasis (V13.01). PSH  Chemosurgery (Mohs Micrographic Technique) Hernia Repair Inguinal Unilateral Previous Stent Placement Treatment Of Ankle Fracture; ORIF Venous Ligation With Stripping. Family Hx  Family history of diabetes mellitus: Grandmother (V18.0) (Z83.3) Family history of hypertension: Father (V17.49) (Z65.49) Reported Family History Ischemic Heart Disease Before Age 13: Father; father Stroke Syndrome (V17.1); father. Personal Hx  Denied Alcohol Use (History) Caffeine Use Former Smoker Marital History Marital History - Currently Married No caffeine use [phone]. ROS  Systemic: Not feeling tired (fatigue).  No fever, no night sweats, and no recent weight loss. Head: No headache. Eyes: No eye symptoms. Otolaryngeal: No hearing loss, no earache, the ears do not feel pressured , stopped up, the ears do not feel full, no tinnitus, and no purulent nasal discharge.  No nasal passage blockage (stuffiness).  Snoring.  No sneezing, no hoarseness, and no sore throat. Cardiovascular: No chest pain or discomfort  and no palpitations. Pulmonary: No dyspnea, no cough, and no wheezing. Gastrointestinal: No dysphagia  and no heartburn.  No nausea, no abdominal pain, and no melena.  No diarrhea. Genitourinary: No dysuria. Endocrine: No muscle weakness. Musculoskeletal: No arthralgias. Neurological: No dizziness, no fainting, and no numbness. Psychological: No anxiety  and no depression. Skin: No rash. 12 system ROS was obtained and reviewed on the Health Maintenance form dated today.  Positive responses are shown above.  If the symptom  is not checked, the patient has  denied it. Vital Signs   Recorded by Skolimowski,Sharon on 18 Aug 2013 01:19 PM BP:118/60,  Height: 5 ft 7.75 in, Weight: 216 lb , BMI: 33.1 kg/m2,  BMI Calculated: 33.09 ,  BSA Calculated: 2.11. Physical Exam  APPEARANCE: Well developed, well nourished, in no acute distress.  Normal affect, in a pleasant mood.  Oriented to time, place and person. COMMUNICATION: Normal voice   HEAD & FACE:  No scars, lesions or masses of head and face.  Sinuses nontender to palpation.  Salivary glands without mass or tenderness.  Facial strength symmetric.  No facial lesion, scars, or mass. EYES: EOMI with normal primary gaze alignment. Visual acuity grossly intact.  PERRLA EXTERNAL EAR & NOSE: No scars, lesions or masses  EAC & TYMPANIC MEMBRANE:  EAC shows no obstructing lesions or debris and tympanic membranes are normal bilaterally with good movement to insufflation. GROSS HEARING: Normal   TMJ:  Nontender  INTRANASAL EXAM: No polyps or purulence.  NASOPHARYNX: Normal, without lesions. LIPS, TEETH & GUMS: Advanced periodontal disease with multiple missing teeth and eroded teeth. ORAL CAVITY/OROPHARYNX:  Oral mucosa moist without lesion or asymmetry of the palate, tongue, tonsil or posterior pharynx. Well-healed upper lip scars. Less than 1 cm ulcerative mass involving the mucosal surface of the upper lip to the right of midline. NECK:  Supple without adenopathy or mass. THYROID:  Normal with no masses palpable.  NEUROLOGIC:  No gross CN deficits. No nystagmus noted.   LYMPHATIC:  No enlarged nodes palpable. Signature  Electronically signed by : Izora Gala  M.D.; 08/18/2013 1:37 PM EST.

## 2013-09-18 NOTE — Pre-Procedure Instructions (Signed)
[name] [name]  09/18/2013   Your procedure is scheduled on:  09/21/13  Report to [facility] Admitting at 9 AM.  Call this number if you have problems the morning of surgery: [phone]   Remember:   Do not eat food or drink liquids after midnight.   Take these medicines the morning of surgery with A SIP OF WATER: carvedilol,snythroid   Do not wear jewelry, make-up or nail polish.  Do not wear lotions, powders, or perfumes. You may wear deodorant.  Do not shave 48 hours prior to surgery. Men may shave face and neck.  Do not bring valuables to the hospital.  [facility] is not responsible                  for any belongings or valuables.               Contacts, dentures or bridgework may not be worn into surgery.  Leave suitcase in the car. After surgery it may be brought to your room.  For patients admitted to the hospital, discharge time is determined by your                treatment team.               Patients discharged the day of surgery will not be allowed to drive  home.  Name and phone number of your driver: family  Special Instructions: Incentive Spirometry - Practice and bring it with you on the day of surgery.   Please read over the following fact sheets that you were given: Pain Booklet, Coughing and Deep Breathing and Surgical Site Infection Prevention

## 2013-09-19 ENCOUNTER — Encounter (HOSPITAL_COMMUNITY): Payer: Self-pay

## 2013-09-19 ENCOUNTER — Encounter (HOSPITAL_COMMUNITY)
Admission: RE | Admit: 2013-09-19 | Discharge: 2013-09-19 | Disposition: A | Discharge status: Home / Self Care | Payer: Medicare Other | Source: Ambulatory Visit | Attending: Otolaryngology | Admitting: Otolaryngology

## 2013-09-19 DIAGNOSIS — F17200 Nicotine dependence, unspecified, uncomplicated: Secondary | ICD-10-CM | POA: Diagnosis not present

## 2013-09-19 DIAGNOSIS — Z01812 Encounter for preprocedural laboratory examination: Secondary | ICD-10-CM | POA: Diagnosis not present

## 2013-09-19 DIAGNOSIS — E785 Hyperlipidemia, unspecified: Secondary | ICD-10-CM | POA: Diagnosis not present

## 2013-09-19 DIAGNOSIS — I252 Old myocardial infarction: Secondary | ICD-10-CM | POA: Diagnosis not present

## 2013-09-19 DIAGNOSIS — J984 Other disorders of lung: Secondary | ICD-10-CM | POA: Diagnosis not present

## 2013-09-19 DIAGNOSIS — Z8659 Personal history of other mental and behavioral disorders: Secondary | ICD-10-CM | POA: Diagnosis not present

## 2013-09-19 DIAGNOSIS — J449 Chronic obstructive pulmonary disease, unspecified: Secondary | ICD-10-CM | POA: Diagnosis not present

## 2013-09-19 DIAGNOSIS — Z85828 Personal history of other malignant neoplasm of skin: Secondary | ICD-10-CM | POA: Diagnosis not present

## 2013-09-19 DIAGNOSIS — I10 Essential (primary) hypertension: Secondary | ICD-10-CM | POA: Diagnosis not present

## 2013-09-19 DIAGNOSIS — Z7901 Long term (current) use of anticoagulants: Secondary | ICD-10-CM | POA: Diagnosis not present

## 2013-09-19 DIAGNOSIS — J4489 Other specified chronic obstructive pulmonary disease: Secondary | ICD-10-CM | POA: Diagnosis not present

## 2013-09-19 DIAGNOSIS — M129 Arthropathy, unspecified: Secondary | ICD-10-CM | POA: Diagnosis not present

## 2013-09-19 DIAGNOSIS — N289 Disorder of kidney and ureter, unspecified: Secondary | ICD-10-CM | POA: Diagnosis not present

## 2013-09-19 DIAGNOSIS — C000 Malignant neoplasm of external upper lip: Secondary | ICD-10-CM | POA: Diagnosis not present

## 2013-09-19 DIAGNOSIS — Z9861 Coronary angioplasty status: Secondary | ICD-10-CM | POA: Diagnosis not present

## 2013-09-19 DIAGNOSIS — I4891 Unspecified atrial fibrillation: Secondary | ICD-10-CM | POA: Diagnosis not present

## 2013-09-19 DIAGNOSIS — I2510 Atherosclerotic heart disease of native coronary artery without angina pectoris: Secondary | ICD-10-CM | POA: Diagnosis not present

## 2013-09-19 DIAGNOSIS — E041 Nontoxic single thyroid nodule: Secondary | ICD-10-CM | POA: Diagnosis not present

## 2013-09-19 DIAGNOSIS — N200 Calculus of kidney: Secondary | ICD-10-CM | POA: Diagnosis not present

## 2013-09-19 DIAGNOSIS — G40909 Epilepsy, unspecified, not intractable, without status epilepticus: Secondary | ICD-10-CM | POA: Diagnosis not present

## 2013-09-19 DIAGNOSIS — Z01818 Encounter for other preprocedural examination: Secondary | ICD-10-CM | POA: Diagnosis not present

## 2013-09-19 HISTORY — DX: Unspecified convulsions: R56.9

## 2013-09-19 LAB — APTT: aPTT: 34 seconds (ref 24–37)

## 2013-09-19 LAB — CBC
HEMATOCRIT: 40.1 % (ref 39.0–52.0)
Hemoglobin: 13.4 g/dL (ref 13.0–17.0)
MCH: 32.4 pg (ref 26.0–34.0)
MCHC: 33.4 g/dL (ref 30.0–36.0)
MCV: 97.1 fL (ref 78.0–100.0)
PLATELETS: 190 10*3/uL (ref 150–400)
RBC: 4.13 MIL/uL — ABNORMAL LOW (ref 4.22–5.81)
RDW: 15.4 % (ref 11.5–15.5)
WBC: 6.3 10*3/uL (ref 4.0–10.5)

## 2013-09-19 LAB — BASIC METABOLIC PANEL
Anion gap: 12 (ref 5–15)
BUN: 14 mg/dL (ref 6–23)
CALCIUM: 8.9 mg/dL (ref 8.4–10.5)
CO2: 25 meq/L (ref 19–32)
CREATININE: 0.88 mg/dL (ref 0.50–1.35)
Chloride: 102 mEq/L (ref 96–112)
GFR calc non Af Amer: 85 mL/min — ABNORMAL LOW (ref >90)
Glucose, Bld: 96 mg/dL (ref 70–99)
Potassium: 4.3 mEq/L (ref 3.7–5.3)
Sodium: 139 mEq/L (ref 137–147)

## 2013-09-19 LAB — PROTIME-INR
INR: 1.09 (ref 0.00–1.49)
Prothrombin Time: 14.1 seconds (ref 11.6–15.2)

## 2013-09-19 NOTE — Progress Notes (Signed)
Anesthesia Chart Review:  Patient is a 73 year old male scheduled for biopsy upper lip, frozen section, wedge resection on 09/21/13 by Dr. Constance Holster. Procedure is posted for MAC anesthesia.  History includes afib/PAF, CAD, MI '05 s/p DES mid RCA and ostial PLV ([place]), CHF, former smoker, psoriasis, HLD, seizure X 1 1970, nephrolithiasis, HTN, hypothyroidism, basal cell skin cancer s/p MOHS '12, hernia repair '08.  BMI is consistent with obesity. Cardiologist is Dr. Aundra Dubin, last visit 12/07/12 with one year follow-up recommended. Dr. Janeice Robinson H&P note mentions that was would talk with patient's cardiologist regarding holding his Coumadin for surgery.  I spoke with Ivin Booty at Dr. Janeice Robinson office who did confirm that they did receive cardiac clearance and permission to hold Coumadin for 5 days preoperatively.  HEM-ONC is Dr. Alvy Bimler.  PCP is listed as Dr. Ernestene Kiel.  EKG on 12/06/12 showed: SR.  He has small q waves in inferior leads and V3-6 which appears insignificant.  These appear unchanged when compared to his 09/25/11 EKG.  Echo on 02/18/11 showed: - Left ventricle: The cavity size was normal. Wall thickness was increased in a pattern of moderate LVH. Systolic function was normal. The estimated ejection fraction was in the range of 55% to 60%. Wall motion was normal; there were no regional wall motion abnormalities. Features are consistent with a pseudonormal left ventricular filling pattern, with concomitant abnormal relaxation and increased filling pressure (grade 2 diastolic dysfunction). - Right atrium: The atrium was mildly dilated. - Atrial septum: No defect or patent foramen ovale was identified.  Cardiology notes indicate that he had a "Myoview in 1/12 with no ischemia or infarction." I did find a stress test report from 02/27/10 [facility]) that showed: No evidence of ischemia or significant myocardial scar.  Normal LV function with calculated EF of 62%.   CXR on 09/19/13 showed no  acute cardiopulmonary abnormality seen.  Preoperative labs noted. PT/PTT WNL.   If no acute changes then I would anticipate that he could proceed as planned.  George [facility] Short Stay Center/Anesthesiology Phone [phone] 09/20/2013 9:34 AM

## 2013-09-20 ENCOUNTER — Telehealth: Payer: Self-pay | Admitting: Cardiology

## 2013-09-20 MED ORDER — CEFAZOLIN SODIUM-DEXTROSE 2-3 GM-% IV SOLR
2.0000 g | INTRAVENOUS | Status: AC
  Administered 2013-09-21: 2 g via INTRAVENOUS
  Filled 2013-09-20: qty 50

## 2013-09-20 NOTE — Telephone Encounter (Signed)
New message     They have lost clearance.  Please fax it again to [phone].  The clearance is for pt to have biopsy tomorrow and he has been holding the coumadin for 5 days

## 2013-09-20 NOTE — Telephone Encounter (Signed)
**Note De-Identified Sholonda Jobst Obfuscation** Left a detailed message on Sharon's VM stating that we do not have a record of ever receiving a clearance letter on this pt and that Dr Aundra Dubin and his nurse is not in the office at this time so we cant f/u with them concerning paperwork.

## 2013-09-21 ENCOUNTER — Encounter (HOSPITAL_COMMUNITY): Payer: Self-pay | Admitting: *Deleted

## 2013-09-21 ENCOUNTER — Encounter (HOSPITAL_COMMUNITY)
Admission: RE | Disposition: A | Discharge status: Home / Self Care | Payer: Self-pay | Source: Ambulatory Visit | Attending: Otolaryngology

## 2013-09-21 ENCOUNTER — Ambulatory Visit (HOSPITAL_COMMUNITY)
Admission: RE | Admit: 2013-09-21 | Discharge: 2013-09-21 | Disposition: A | Discharge status: Home / Self Care | Payer: Medicare Other | Source: Ambulatory Visit | Attending: Otolaryngology | Admitting: Otolaryngology

## 2013-09-21 ENCOUNTER — Ambulatory Visit (HOSPITAL_COMMUNITY): Payer: Medicare Other | Admitting: Certified Registered Nurse Anesthetist

## 2013-09-21 ENCOUNTER — Encounter (HOSPITAL_COMMUNITY): Payer: Medicare Other | Admitting: Vascular Surgery

## 2013-09-21 DIAGNOSIS — E041 Nontoxic single thyroid nodule: Secondary | ICD-10-CM | POA: Insufficient documentation

## 2013-09-21 DIAGNOSIS — N200 Calculus of kidney: Secondary | ICD-10-CM | POA: Diagnosis not present

## 2013-09-21 DIAGNOSIS — I10 Essential (primary) hypertension: Secondary | ICD-10-CM | POA: Insufficient documentation

## 2013-09-21 DIAGNOSIS — F17200 Nicotine dependence, unspecified, uncomplicated: Secondary | ICD-10-CM | POA: Insufficient documentation

## 2013-09-21 DIAGNOSIS — Z01812 Encounter for preprocedural laboratory examination: Secondary | ICD-10-CM | POA: Insufficient documentation

## 2013-09-21 DIAGNOSIS — I2510 Atherosclerotic heart disease of native coronary artery without angina pectoris: Secondary | ICD-10-CM | POA: Insufficient documentation

## 2013-09-21 DIAGNOSIS — J4489 Other specified chronic obstructive pulmonary disease: Secondary | ICD-10-CM | POA: Insufficient documentation

## 2013-09-21 DIAGNOSIS — N289 Disorder of kidney and ureter, unspecified: Secondary | ICD-10-CM | POA: Insufficient documentation

## 2013-09-21 DIAGNOSIS — Z01818 Encounter for other preprocedural examination: Secondary | ICD-10-CM | POA: Insufficient documentation

## 2013-09-21 DIAGNOSIS — J449 Chronic obstructive pulmonary disease, unspecified: Secondary | ICD-10-CM | POA: Insufficient documentation

## 2013-09-21 DIAGNOSIS — Z7901 Long term (current) use of anticoagulants: Secondary | ICD-10-CM | POA: Diagnosis not present

## 2013-09-21 DIAGNOSIS — I4891 Unspecified atrial fibrillation: Secondary | ICD-10-CM | POA: Diagnosis not present

## 2013-09-21 DIAGNOSIS — Z9861 Coronary angioplasty status: Secondary | ICD-10-CM | POA: Insufficient documentation

## 2013-09-21 DIAGNOSIS — D3705 Neoplasm of uncertain behavior of pharynx: Secondary | ICD-10-CM | POA: Diagnosis not present

## 2013-09-21 DIAGNOSIS — D3701 Neoplasm of uncertain behavior of lip: Secondary | ICD-10-CM | POA: Diagnosis not present

## 2013-09-21 DIAGNOSIS — C4401 Basal cell carcinoma of skin of lip: Secondary | ICD-10-CM

## 2013-09-21 DIAGNOSIS — Z8659 Personal history of other mental and behavioral disorders: Secondary | ICD-10-CM | POA: Insufficient documentation

## 2013-09-21 DIAGNOSIS — C430 Malignant melanoma of lip: Secondary | ICD-10-CM | POA: Diagnosis not present

## 2013-09-21 DIAGNOSIS — I252 Old myocardial infarction: Secondary | ICD-10-CM | POA: Insufficient documentation

## 2013-09-21 DIAGNOSIS — Z85828 Personal history of other malignant neoplasm of skin: Secondary | ICD-10-CM | POA: Insufficient documentation

## 2013-09-21 DIAGNOSIS — E785 Hyperlipidemia, unspecified: Secondary | ICD-10-CM | POA: Insufficient documentation

## 2013-09-21 DIAGNOSIS — C000 Malignant neoplasm of external upper lip: Secondary | ICD-10-CM | POA: Insufficient documentation

## 2013-09-21 DIAGNOSIS — M129 Arthropathy, unspecified: Secondary | ICD-10-CM | POA: Insufficient documentation

## 2013-09-21 DIAGNOSIS — G40909 Epilepsy, unspecified, not intractable, without status epilepticus: Secondary | ICD-10-CM | POA: Insufficient documentation

## 2013-09-21 HISTORY — PX: LIPOMA EXCISION: SHX5283

## 2013-09-21 SURGERY — EXCISION LIPOMA
Anesthesia: Monitor Anesthesia Care | Site: Mouth | Laterality: Right

## 2013-09-21 MED ORDER — BACITRACIN ZINC 500 UNIT/GM EX OINT
TOPICAL_OINTMENT | CUTANEOUS | Status: AC
  Filled 2013-09-21: qty 15

## 2013-09-21 MED ORDER — PROPOFOL INFUSION 10 MG/ML OPTIME
INTRAVENOUS | Status: DC | PRN
  Administered 2013-09-21: 50 ug/kg/min via INTRAVENOUS

## 2013-09-21 MED ORDER — PROPOFOL 10 MG/ML IV BOLUS
INTRAVENOUS | Status: DC | PRN
  Administered 2013-09-21 (×2): 20 mg via INTRAVENOUS

## 2013-09-21 MED ORDER — ONDANSETRON HCL 4 MG/2ML IJ SOLN
INTRAMUSCULAR | Status: DC | PRN
  Administered 2013-09-21: 4 mg via INTRAVENOUS

## 2013-09-21 MED ORDER — LIDOCAINE-EPINEPHRINE 1 %-1:100000 IJ SOLN
INTRAMUSCULAR | Status: DC | PRN
  Administered 2013-09-21: 4 mL

## 2013-09-21 MED ORDER — PROPOFOL 10 MG/ML IV BOLUS
INTRAVENOUS | Status: AC
  Filled 2013-09-21: qty 40

## 2013-09-21 MED ORDER — FENTANYL CITRATE 0.05 MG/ML IJ SOLN
INTRAMUSCULAR | Status: DC | PRN
  Administered 2013-09-21: 25 ug via INTRAVENOUS
  Administered 2013-09-21: 50 ug via INTRAVENOUS

## 2013-09-21 MED ORDER — HYDROMORPHONE HCL PF 1 MG/ML IJ SOLN: 0.2500 mg | INTRAMUSCULAR | Status: DC | PRN

## 2013-09-21 MED ORDER — LACTATED RINGERS IV SOLN
INTRAVENOUS | Status: DC
  Administered 2013-09-21: 09:00:00 via INTRAVENOUS

## 2013-09-21 MED ORDER — MIDAZOLAM HCL 2 MG/2ML IJ SOLN
INTRAMUSCULAR | Status: AC
  Filled 2013-09-21: qty 2

## 2013-09-21 MED ORDER — ONDANSETRON 4 MG PO TBDP: 4.0000 mg | ORAL_TABLET | Freq: Three times a day (TID) | ORAL | Status: DC | PRN

## 2013-09-21 MED ORDER — HYDROCODONE-ACETAMINOPHEN 7.5-325 MG PO TABS: 1.0000 | ORAL_TABLET | Freq: Four times a day (QID) | ORAL | Status: DC | PRN

## 2013-09-21 MED ORDER — 0.9 % SODIUM CHLORIDE (POUR BTL) OPTIME
TOPICAL | Status: DC | PRN
  Administered 2013-09-21: 1000 mL

## 2013-09-21 MED ORDER — LACTATED RINGERS IV SOLN
INTRAVENOUS | Status: DC | PRN
  Administered 2013-09-21: 09:00:00 via INTRAVENOUS

## 2013-09-21 MED ORDER — PROPOFOL 10 MG/ML IV BOLUS
INTRAVENOUS | Status: AC
  Filled 2013-09-21: qty 20

## 2013-09-21 MED ORDER — LIDOCAINE-EPINEPHRINE 1 %-1:100000 IJ SOLN
INTRAMUSCULAR | Status: AC
  Filled 2013-09-21: qty 1

## 2013-09-21 MED ORDER — CEPHALEXIN 500 MG PO CAPS: 500.0000 mg | ORAL_CAPSULE | Freq: Three times a day (TID) | ORAL | Status: DC

## 2013-09-21 MED ORDER — FENTANYL CITRATE 0.05 MG/ML IJ SOLN
INTRAMUSCULAR | Status: AC
  Filled 2013-09-21: qty 5

## 2013-09-21 MED ORDER — LIDOCAINE HCL (CARDIAC) 20 MG/ML IV SOLN
INTRAVENOUS | Status: DC | PRN
  Administered 2013-09-21: 100 mg via INTRAVENOUS

## 2013-09-21 MED ORDER — ONDANSETRON HCL 4 MG/2ML IJ SOLN: 4.0000 mg | Freq: Once | INTRAMUSCULAR | Status: DC | PRN

## 2013-09-21 SURGICAL SUPPLY — 38 items
ATTRACTOMAT 16X20 MAGNETIC DRP (DRAPES) IMPLANT
BANDAGE GAUZE ELAST BULKY 4 IN (GAUZE/BANDAGES/DRESSINGS) IMPLANT
CANISTER SUCTION 2500CC (MISCELLANEOUS) IMPLANT
CLEANER TIP ELECTROSURG 2X2 (MISCELLANEOUS) ×2 IMPLANT
CONT SPEC 4OZ CLIKSEAL STRL BL (MISCELLANEOUS) ×2 IMPLANT
DRAIN PENROSE 1/4X12 LTX STRL (WOUND CARE) IMPLANT
DRSG EMULSION OIL 3X3 NADH (GAUZE/BANDAGES/DRESSINGS) IMPLANT
ELECT COATED BLADE 2.86 ST (ELECTRODE) ×2 IMPLANT
ELECT NEEDLE TIP 2.8 STRL (NEEDLE) IMPLANT
ELECT REM PT RETURN 9FT ADLT (ELECTROSURGICAL) ×2
ELECTRODE REM PT RTRN 9FT ADLT (ELECTROSURGICAL) ×1 IMPLANT
GLOVE BIOGEL PI IND STRL 7.0 (GLOVE) ×1 IMPLANT
GLOVE BIOGEL PI INDICATOR 7.0 (GLOVE) ×1
GLOVE ECLIPSE 7.5 STRL STRAW (GLOVE) ×2 IMPLANT
GLOVE SURG SS PI 7.0 STRL IVOR (GLOVE) ×2 IMPLANT
GOWN STRL REUS W/ TWL LRG LVL3 (GOWN DISPOSABLE) ×2 IMPLANT
GOWN STRL REUS W/TWL LRG LVL3 (GOWN DISPOSABLE) ×2
KIT BASIN OR (CUSTOM PROCEDURE TRAY) ×2 IMPLANT
KIT ROOM TURNOVER OR (KITS) ×2 IMPLANT
NEEDLE 25GX 5/8IN NON SAFETY (NEEDLE) IMPLANT
NEEDLE BLUNT 18X1 FOR OR ONLY (NEEDLE) IMPLANT
NS IRRIG 1000ML POUR BTL (IV SOLUTION) ×2 IMPLANT
PAD ARMBOARD 7.5X6 YLW CONV (MISCELLANEOUS) ×4 IMPLANT
PENCIL FOOT CONTROL (ELECTRODE) ×2 IMPLANT
SPONGE GAUZE 4X4 12PLY (GAUZE/BANDAGES/DRESSINGS) IMPLANT
SUT CHROMIC 3 0 PS 2 (SUTURE) ×2 IMPLANT
SUT CHROMIC 4 0 P 3 18 (SUTURE) IMPLANT
SUT ETHILON 4 0 PS 2 18 (SUTURE) IMPLANT
SUT ETHILON 5 0 P 3 18 (SUTURE) ×2
SUT NYLON ETHILON 5-0 P-3 1X18 (SUTURE) ×2 IMPLANT
SUT SILK 4 0 REEL (SUTURE) ×2 IMPLANT
SWAB COLLECTION DEVICE MRSA (MISCELLANEOUS) IMPLANT
SYR TB 1ML LUER SLIP (SYRINGE) IMPLANT
TOWEL OR 17X24 6PK STRL BLUE (TOWEL DISPOSABLE) IMPLANT
TOWEL OR 17X26 10 PK STRL BLUE (TOWEL DISPOSABLE) ×2 IMPLANT
TRAY ENT MC OR (CUSTOM PROCEDURE TRAY) ×2 IMPLANT
TUBE ANAEROBIC SPECIMEN COL (MISCELLANEOUS) IMPLANT
WATER STERILE IRR 1000ML POUR (IV SOLUTION) IMPLANT

## 2013-09-21 NOTE — Progress Notes (Signed)
Pt awake and alert *4, vitals stable, comfortable no complaints. Ready to go home.Pt went home with his son & wife. Gershon Crane

## 2013-09-21 NOTE — Transfer of Care (Signed)
Immediate Anesthesia Transfer of Care Note  Patient: [name] [name]  Procedure(s) Performed: Procedure(s): LIP BIOPSY WITH FROZEN SECTION AND WEDGE EXCISION OF RIGHT UPPER LIP  (Right)  Patient Location: PACU  Anesthesia Type:MAC  Level of Consciousness: awake, alert , oriented and patient cooperative  Airway & Oxygen Therapy: Patient Spontanous Breathing and Patient connected to nasal cannula oxygen  Post-op Assessment: Report given to PACU RN, Post -op Vital signs reviewed and stable and Patient moving all extremities  Post vital signs: Reviewed and stable  Complications: No apparent anesthesia complications

## 2013-09-21 NOTE — Op Note (Signed)
OPERATIVE REPORT  DATE OF SURGERY: 09/21/2013  PATIENT:  [name] [name],  73 y.o. male  PRE-OPERATIVE DIAGNOSIS:  LIP MASS  POST-OPERATIVE DIAGNOSIS:  LIP MASS  PROCEDURE:  Procedure(s): LIP BIOPSY WITH FROZEN SECTION AND WEDGE EXCISION OF RIGHT UPPER LIP   SURGEON:  Beckie Salts, MD  ASSISTANTS: None   ANESTHESIA:   Local with intravenous sedation, monitored anesthesia care  EBL:  10 ml  DRAINS: None   LOCAL MEDICATIONS USED:  1% Xylocaine with epinephrine  SPECIMEN:  First specimen right upper lip wedge resection, sutures mark the vermilion margin and the right mucosal margin. Frozen section revealed positive margin at the vermilion and on the left side. Reexcision taken, sutures mark the left mucosal margin, vermilion and mucosal side.   COUNTS:  Correct  PROCEDURE DETAILS: The patient was taken to the operating room and placed on the operating table in the supine position. Following intravenous sedation the patient was draped in a standard fashion. Local anesthetic was infiltrated into the upper lip after marking the proposed incisions. Prior surgery had left a scar along the right side upper lip with nice alignment of the vermilion border. Grossly this appeared uninvolved on the mucosal side was a 1 x 1.2 cm irregular and firm lesion. What was felt to be adequate margins were outlined around this. Initial wedge was resected and sent for frozen section analysis. An additional 5 mm margin involving the left side of the mucosa and the entire vermilion apex of the original resection. The labial artery was tied with a 4-0 silk ties. The defect was closed in layers using interrupted 3-0 chromic sutures. The vermilion was lined up properly. Patient was awakened from sedation and transferred to recovery in stable condition.    PATIENT DISPOSITION:  To PACU, stable

## 2013-09-21 NOTE — Interval H&P Note (Signed)
History and Physical Interval Note:  09/21/2013 10:54 AM  [name] [name]  has presented today for surgery, with the diagnosis of LIP MASS  The various methods of treatment have been discussed with the patient and family. After consideration of risks, benefits and other options for treatment, the patient has consented to  Procedure(s): LIP BIOPSY WITH FROZEN SECTION AND WEDGE EXCISION OF RIGHT UPPER LIP  (Right) as a surgical intervention .  The patient's history has been reviewed, patient examined, no change in status, stable for surgery.  I have reviewed the patient's chart and labs.  Questions were answered to the patient's satisfaction.     [name]

## 2013-09-21 NOTE — Discharge Instructions (Signed)
Resume Coumadin on Saturday. Keep antibiotic ointment on the lip.

## 2013-09-21 NOTE — Anesthesia Preprocedure Evaluation (Signed)
Anesthesia Evaluation  Patient identified by MRN, date of birth, ID band Patient awake    Reviewed: Allergy & Precautions, H&P , NPO status , Patient's Chart, lab work & pertinent test results  Airway       Dental   Pulmonary COPDCurrent Smoker, former smoker,          Cardiovascular hypertension, + CAD and + Past MI     Neuro/Psych Seizures -,     GI/Hepatic   Endo/Other  Hypothyroidism   Renal/GU Renal disease     Musculoskeletal   Abdominal   Peds  Hematology   Anesthesia Other Findings   Reproductive/Obstetrics                           Anesthesia Physical Anesthesia Plan  ASA: III  Anesthesia Plan: MAC   Post-op Pain Management:    Induction: Intravenous  Airway Management Planned: Nasal Cannula  Additional Equipment:   Intra-op Plan:   Post-operative Plan:   Informed Consent: I have reviewed the patients History and Physical, chart, labs and discussed the procedure including the risks, benefits and alternatives for the proposed anesthesia with the patient or authorized representative who has indicated his/her understanding and acceptance.     Plan Discussed with:   Anesthesia Plan Comments:         Anesthesia Quick Evaluation

## 2013-09-22 ENCOUNTER — Encounter (HOSPITAL_COMMUNITY): Payer: Self-pay | Admitting: Otolaryngology

## 2013-09-22 NOTE — Anesthesia Postprocedure Evaluation (Signed)
  Anesthesia Post-op Note  Patient: [name] [name]  Procedure(s) Performed: Procedure(s): LIP BIOPSY WITH FROZEN SECTION AND WEDGE EXCISION OF RIGHT UPPER LIP  (Right)  Patient Location: PACU  Anesthesia Type:MAC  Level of Consciousness: awake, alert , oriented and patient cooperative  Airway and Oxygen Therapy: Patient Spontanous Breathing  Post-op Pain: none  Post-op Assessment: Post-op Vital signs reviewed, Patient's Cardiovascular Status Stable, Respiratory Function Stable, Patent Airway, No signs of Nausea or vomiting and Pain level controlled  Post-op Vital Signs: stable  Last Vitals:  Filed Vitals:   09/21/13 1345  BP: 139/79  Pulse: 55  Temp: 36.7 C  Resp: 14    Complications: No apparent anesthesia complications

## 2013-09-27 ENCOUNTER — Ambulatory Visit (INDEPENDENT_AMBULATORY_CARE_PROVIDER_SITE_OTHER): Payer: Medicare Other | Admitting: *Deleted

## 2013-09-27 DIAGNOSIS — Z5181 Encounter for therapeutic drug level monitoring: Secondary | ICD-10-CM

## 2013-09-27 DIAGNOSIS — I4891 Unspecified atrial fibrillation: Secondary | ICD-10-CM

## 2013-09-27 DIAGNOSIS — Z7901 Long term (current) use of anticoagulants: Secondary | ICD-10-CM

## 2013-09-27 LAB — POCT INR: INR: 1.1

## 2013-10-02 ENCOUNTER — Encounter: Payer: Self-pay | Admitting: Hematology and Oncology

## 2013-10-02 NOTE — Progress Notes (Signed)
Patient left a message I called and left a message on vmail.

## 2013-10-03 ENCOUNTER — Ambulatory Visit (HOSPITAL_COMMUNITY): Payer: Self-pay | Admitting: Dentistry

## 2013-10-03 ENCOUNTER — Encounter (HOSPITAL_COMMUNITY): Payer: Self-pay | Admitting: Dentistry

## 2013-10-03 VITALS — BP 137/83 | HR 51 | Temp 97.7°F

## 2013-10-03 DIAGNOSIS — Z7901 Long term (current) use of anticoagulants: Secondary | ICD-10-CM

## 2013-10-03 DIAGNOSIS — K083 Retained dental root: Secondary | ICD-10-CM

## 2013-10-03 DIAGNOSIS — K0530 Chronic periodontitis, unspecified: Secondary | ICD-10-CM

## 2013-10-03 DIAGNOSIS — K011 Impacted teeth: Secondary | ICD-10-CM

## 2013-10-03 DIAGNOSIS — K045 Chronic apical periodontitis: Secondary | ICD-10-CM

## 2013-10-03 DIAGNOSIS — I480 Paroxysmal atrial fibrillation: Secondary | ICD-10-CM

## 2013-10-03 DIAGNOSIS — K029 Dental caries, unspecified: Secondary | ICD-10-CM

## 2013-10-03 NOTE — Progress Notes (Signed)
LIMITED ORAL EXAMINATION:  10/03/2013 [name] [name] [id number]  VITALS: BP 137/83  Pulse 51  Temp(Src) 97.7 F (36.5 C) (Oral)  LABS:  Lab Results  Component Value Date   WBC 6.3 09/19/2013   HGB 13.4 09/19/2013   HCT 40.1 09/19/2013   MCV 97.1 09/19/2013   PLT 190 09/19/2013   BMET    Component Value Date/Time   NA 139 09/19/2013 1312   K 4.3 09/19/2013 1312   CL 102 09/19/2013 1312   CO2 25 09/19/2013 1312   GLUCOSE 96 09/19/2013 1312   BUN 14 09/19/2013 1312   CREATININE 0.88 09/19/2013 1312   CALCIUM 8.9 09/19/2013 1312   GFRNONAA 85* 09/19/2013 1312   GFRAA >90 09/19/2013 1312    Lab Results  Component Value Date   INR 1.1 09/27/2013   INR 1.09 09/19/2013   INR 2.2 08/30/2013   No results found for this basename: PTT     Patient is status post excision of upper lip lesion with Dr. Constance Holster on 09/21/2013. Pathology noted some basal cell carcinoma as well as benign squamous line mucosa. Previous discussion with Dr. Constance Holster indicated that patient was ready to proceed with dental procedures as indicated. Patient now presents for discussion of treatment plan and timing of the dental treatment.  SUBJECTIVE: Patient with some persistent discomfort involving the upper lip but indicates that he is very satisfied with a surgical results. Patient indicates that the area is still tender and he would like to defer dental procedures for an additional 2-3 weeks before considering additional oral surgery.   EXAM: Upper lip surgical site appears to be healing in well with no evidence of infection, heme, or problems with healing. Patient with poor dentition and other dental problems as before.  ASSESSMENTS: 1. chronic apical periodontitis 2. Multiple dental caries 3. Multiple retained root segments 4. Chronic periodontitis with bone loss 5. Gingival recession 6. Tooth mobility  7. Poor occlusal scheme 8. Mandibular tori 9. Coumadin therapy with risk for bleeding with invasive dental procedures 10.  Impacted tooth #16   PLAN: 1. I discussed the risks, benefits, and complications of various treatment options with the patient in relationship to his medical and dental conditions, current Coumadin therapy, and risk for bleeding with invasive dental procedures and other potential cardio vascular complications including stroke and death.  We discussed no treatment, multiple extractions with alveoloplasty, pre-prosthetic surgery as indicated, periodontal therapy, dental restorations, root canal therapy, crown and bridge therapy, implant therapy, and replacement of missing teeth is indicated after adequate healing. We also discussed referral to an oral surgeon for anticipated surgical procedures. The patient currently wishes to proceed with multiple extractions with alveoloplasty and pre-prosthetic surgery as indicated in the operating room with general anesthesia along with initial periodontal therapy by dental medicine. The patient will then followup with a new primary dentist for dental restorations and evaluation for replacement of missing teeth as indicated after adequate healing. The patient refused referral to an oral surgeon at this time. Patient understands potential risk for bleeding, bruising, swelling, infection, pain, root tip fracture, mandible fracture, sinus involvement, soft tissue damage, damage to dental restorations, and floor of mouth soft tissue injury. Patient also understands potential risk for other cardiovascular and respiratory complications up to and including death and stroke. The patient wishes to defer dental treatment at this time, however, to allow for more healing from the upper lip surgery. Due to the need for a medical evaluation prior to dental surgery, the patient will wait  until he sees Dr. Asa Lente on 11/02/2013  before scheduling surgery. Coumadin therapy adjustment will then be discussed with Dr. Loralie Champagne prior to the oral surgery.  2. Discussion of findings with  medical team and coordination of dental care as indicated.  Lenn Cal, DDS

## 2013-10-03 NOTE — Patient Instructions (Signed)
The patient is to follow up with Dr. Gwendolyn Grant on 11/02/2013. Patient will then contact dental medicine to arrange for oral surgery. Coumadin therapy will need to be adjusted to allow for normalization of his INR with surgical extractions and pre-prosthetic surgery to follow in the operating room with general anesthesia. Dr. Aundra Dubin will be consulted as needed. Lenn Cal, DDS

## 2013-10-05 ENCOUNTER — Encounter: Payer: Self-pay | Admitting: *Deleted

## 2013-10-05 ENCOUNTER — Telehealth: Payer: Self-pay | Admitting: *Deleted

## 2013-10-05 NOTE — Telephone Encounter (Signed)
FYI. Thanks. -----                                                                                                                                                                           Message --From: Larey Dresser, MD Sent: 10/03/2013 5:39 PM To: Katrine Coho, RN May hold coumadin for oral surgery. Does not have to have Lovenox bridge. -----                   Message ----- From: Lenn Cal, DDS Sent: 10/03/2013 12:26 PM To: Larey Dresser, MD, Rowe Clack, MD Ria Bush; I will need H&P type note before scheduling this patient for oral surgery in the OR with [facility]. Will work with Dr.MCLean to DC coumadin before oral surgery. Dr. Enrique Sack Call if needed. [phone]

## 2013-10-09 ENCOUNTER — Ambulatory Visit (INDEPENDENT_AMBULATORY_CARE_PROVIDER_SITE_OTHER): Payer: Medicare Other | Admitting: Pharmacist

## 2013-10-09 DIAGNOSIS — I4891 Unspecified atrial fibrillation: Secondary | ICD-10-CM | POA: Diagnosis not present

## 2013-10-09 DIAGNOSIS — Z7901 Long term (current) use of anticoagulants: Secondary | ICD-10-CM

## 2013-10-09 DIAGNOSIS — I480 Paroxysmal atrial fibrillation: Secondary | ICD-10-CM

## 2013-10-09 DIAGNOSIS — Z5181 Encounter for therapeutic drug level monitoring: Secondary | ICD-10-CM | POA: Diagnosis not present

## 2013-10-09 LAB — POCT INR: INR: 2.1

## 2013-10-25 ENCOUNTER — Other Ambulatory Visit: Payer: Self-pay | Admitting: Cardiology

## 2013-10-30 ENCOUNTER — Ambulatory Visit (INDEPENDENT_AMBULATORY_CARE_PROVIDER_SITE_OTHER): Payer: Medicare Other

## 2013-10-30 DIAGNOSIS — Z5181 Encounter for therapeutic drug level monitoring: Secondary | ICD-10-CM | POA: Diagnosis not present

## 2013-10-30 DIAGNOSIS — Z7901 Long term (current) use of anticoagulants: Secondary | ICD-10-CM

## 2013-10-30 DIAGNOSIS — I4891 Unspecified atrial fibrillation: Secondary | ICD-10-CM | POA: Diagnosis not present

## 2013-10-30 LAB — POCT INR: INR: 1.5

## 2013-11-01 ENCOUNTER — Encounter: Payer: Self-pay | Admitting: Cardiology

## 2013-11-01 ENCOUNTER — Other Ambulatory Visit: Payer: Self-pay

## 2013-11-01 ENCOUNTER — Ambulatory Visit (INDEPENDENT_AMBULATORY_CARE_PROVIDER_SITE_OTHER): Payer: Medicare Other | Admitting: Cardiology

## 2013-11-01 VITALS — BP 140/90 | HR 52 | Ht 67.0 in | Wt 219.0 lb

## 2013-11-01 DIAGNOSIS — E785 Hyperlipidemia, unspecified: Secondary | ICD-10-CM | POA: Diagnosis not present

## 2013-11-01 DIAGNOSIS — I4891 Unspecified atrial fibrillation: Secondary | ICD-10-CM

## 2013-11-01 DIAGNOSIS — I480 Paroxysmal atrial fibrillation: Secondary | ICD-10-CM

## 2013-11-01 DIAGNOSIS — I2510 Atherosclerotic heart disease of native coronary artery without angina pectoris: Secondary | ICD-10-CM | POA: Diagnosis not present

## 2013-11-01 LAB — CBC WITH DIFFERENTIAL/PLATELET
BASOS ABS: 0 10*3/uL (ref 0.0–0.1)
Basophils Relative: 0.6 % (ref 0.0–3.0)
EOS ABS: 0.2 10*3/uL (ref 0.0–0.7)
Eosinophils Relative: 2 % (ref 0.0–5.0)
HCT: 39.2 % (ref 39.0–52.0)
HEMOGLOBIN: 12.9 g/dL — AB (ref 13.0–17.0)
LYMPHS PCT: 18.5 % (ref 12.0–46.0)
Lymphs Abs: 1.4 10*3/uL (ref 0.7–4.0)
MCHC: 33.1 g/dL (ref 30.0–36.0)
MCV: 86.9 fl (ref 78.0–100.0)
Monocytes Absolute: 0.8 10*3/uL (ref 0.1–1.0)
Monocytes Relative: 10.2 % (ref 3.0–12.0)
Neutro Abs: 5.4 10*3/uL (ref 1.4–7.7)
Neutrophils Relative %: 68.7 % (ref 43.0–77.0)
Platelets: 345 10*3/uL (ref 150.0–400.0)
RBC: 4.51 Mil/uL (ref 4.22–5.81)
RDW: 15.2 % (ref 11.5–15.5)
WBC: 7.8 10*3/uL (ref 4.0–10.5)

## 2013-11-01 MED ORDER — WARFARIN SODIUM 5 MG PO TABS: ORAL_TABLET | ORAL | Status: DC

## 2013-11-01 MED ORDER — SIMVASTATIN 40 MG PO TABS: 40.0000 mg | ORAL_TABLET | Freq: Every day | ORAL | Status: DC

## 2013-11-01 MED ORDER — CARVEDILOL 12.5 MG PO TABS: 12.5000 mg | ORAL_TABLET | Freq: Two times a day (BID) | ORAL | Status: DC

## 2013-11-01 NOTE — Progress Notes (Signed)
Patient ID: [name] [name], male   DOB: 01-15-1941, 73 y.o.   MRN: [id number] PCP: Dr. Laqueta Due  73 y.o. with history of CAD s/p PCI in 2005 and paroxysmal atrial fibrillation presents for cardiology followup.  He has been doing well.  He does yardwork and can walk up a hill with no dyspnea.  He is able to split wood without problems.  He is able to push mow. He has some generalized fatigue that is chronic.  No chest pain.  No orthopnea or PND.  No syncope.   No stroke-like symptoms.  No tachypalpitations.  He is on coumadin for PAF.  BP is borderline elevated today but runs in the 413K systolic for the most part at home.   ECG: NSR, normal  Labs (10/12): K 4.2, creatinine 1.0, LDL 70, HDL 54, AST/ALT ok. Labs (6/13): K 4.1, creatinine 0.86, HCT 40.8, LDL 69, HDL 49 Labs (7/14): K 3.8, creatinine 0.84, HCT 40.5, LDL 81, HDL 51, TSH normal Labs (1/15): HCT 42.8, LDL 74, HDL 48 Labs (7/15): K 4.3, creatinine 0.88  PMH: 1. Gilberts syndrome 2. CAD: NSTEMI in 2005 with DES to mid RCA and ostial PLV ([facility]).  Myoview in 1/12 with no ischemia or infarction. Echo (12/12): EF 55-60%, moderate LVH, grade II diastolic dysfunction.  3. Hypothyroidism 4. HTN  5. Nephrolithiasis 6. Hyperlipidemia 7. Atrial fibrillation: First noted in 12/12.   SH: Married (Brenda Bartolome).  3 children.  Lives in [place].  Retired Chief Financial Officer.  Quit smoking in 1994.   FH: No premature CAD   Current Outpatient Prescriptions  Medication Sig Dispense Refill  . carvedilol (COREG) 12.5 MG tablet Take 1 tablet (12.5 mg total) by mouth 2 (two) times daily with a meal.  180 tablet  3  . Glucosamine-Chondroit-Vit C-Mn (GLUCOSAMINE 1500 COMPLEX PO) Take 1 tablet by mouth 2 (two) times daily.       Marland Kitchen levothyroxine (SYNTHROID, LEVOTHROID) 50 MCG tablet Take 50 mcg by mouth daily.        . nitroGLYCERIN (NITROSTAT) 0.4 MG SL tablet Place 0.4 mg under the tongue every 5 (five) minutes as needed for chest pain.      . simvastatin  (ZOCOR) 40 MG tablet Take 1 tablet (40 mg total) by mouth daily.  90 tablet  3  . warfarin (COUMADIN) 5 MG tablet Take as directed by anticoagulation clinic  105 tablet  1   No current facility-administered medications for this visit.    BP 140/90  Pulse 52  Ht 5\' 7"  (1.702 m)  Wt 99.338 kg (219 lb)  BMI 34.29 kg/m2 General: NAD, overweight.  Neck: JVP 7 cm, no thyromegaly or thyroid nodule.  Lungs: Clear to auscultation bilaterally with normal respiratory effort. CV: Nondisplaced PMI.  Heart regular S1/S2, no S3/S4, no murmur.  Trace ankle edema with lower leg varicosities.  No carotid bruit.  Normal pedal pulses.  Abdomen: Soft, nontender, no hepatosplenomegaly, no distention.  Neurologic: Alert and oriented x 3.  Psych: Normal affect. Extremities: No clubbing or cyanosis.   Assessment/Plan: 1. Atrial fibrillation: Paroxysmal.  NSR today.  No symptomatic recurrence.  We have talked about NOAC in the past, he wants to continue warfarin, has not had any problems with it.  Check CBC today.  2. CAD: Stable, no ischemic symptoms.  Not on aspirin as he is on warfarin.  Continue statin, Coreg.  3. Hyperlipidemia: Lipids ok in 1/15. Continue statin.  4. Venous insufficiency: Patient has bilateral lower leg varicosities and mild edema.  I suggested that he wear compression stockings.   Followup in 1 year.   Loralie Champagne 11/01/2013 10:49 PM

## 2013-11-01 NOTE — Patient Instructions (Signed)
Your physician recommends that you have  lab work today--CBCd  Your physician wants you to follow-up in: 1 year with Dr Aundra Dubin. (August 2016).You will receive a reminder letter in the mail two months in advance. If you don't receive a letter, please call our office to schedule the follow-up appointment.

## 2013-11-02 ENCOUNTER — Ambulatory Visit: Payer: Self-pay | Admitting: Internal Medicine

## 2013-11-13 ENCOUNTER — Ambulatory Visit (INDEPENDENT_AMBULATORY_CARE_PROVIDER_SITE_OTHER): Payer: Medicare Other | Admitting: Pharmacist

## 2013-11-13 DIAGNOSIS — Z7901 Long term (current) use of anticoagulants: Secondary | ICD-10-CM | POA: Diagnosis not present

## 2013-11-13 DIAGNOSIS — I4891 Unspecified atrial fibrillation: Secondary | ICD-10-CM | POA: Diagnosis not present

## 2013-11-13 DIAGNOSIS — Z5181 Encounter for therapeutic drug level monitoring: Secondary | ICD-10-CM | POA: Diagnosis not present

## 2013-11-13 LAB — POCT INR: INR: 2.2

## 2013-12-11 ENCOUNTER — Ambulatory Visit (INDEPENDENT_AMBULATORY_CARE_PROVIDER_SITE_OTHER): Payer: Medicare Other | Admitting: Surgery

## 2013-12-11 DIAGNOSIS — I4891 Unspecified atrial fibrillation: Secondary | ICD-10-CM | POA: Diagnosis not present

## 2013-12-11 DIAGNOSIS — Z5181 Encounter for therapeutic drug level monitoring: Secondary | ICD-10-CM | POA: Diagnosis not present

## 2013-12-11 DIAGNOSIS — Z7901 Long term (current) use of anticoagulants: Secondary | ICD-10-CM | POA: Diagnosis not present

## 2013-12-11 LAB — POCT INR: INR: 2

## 2013-12-13 ENCOUNTER — Other Ambulatory Visit: Payer: Self-pay | Admitting: Cardiology

## 2013-12-21 ENCOUNTER — Ambulatory Visit (INDEPENDENT_AMBULATORY_CARE_PROVIDER_SITE_OTHER): Payer: Medicare Other | Admitting: Internal Medicine

## 2013-12-21 ENCOUNTER — Encounter: Payer: Self-pay | Admitting: Internal Medicine

## 2013-12-21 ENCOUNTER — Other Ambulatory Visit (INDEPENDENT_AMBULATORY_CARE_PROVIDER_SITE_OTHER): Payer: Medicare Other

## 2013-12-21 VITALS — BP 128/78 | HR 50 | Temp 97.6°F | Ht 67.0 in | Wt 218.8 lb

## 2013-12-21 DIAGNOSIS — I2510 Atherosclerotic heart disease of native coronary artery without angina pectoris: Secondary | ICD-10-CM

## 2013-12-21 DIAGNOSIS — I480 Paroxysmal atrial fibrillation: Secondary | ICD-10-CM

## 2013-12-21 DIAGNOSIS — I10 Essential (primary) hypertension: Secondary | ICD-10-CM

## 2013-12-21 DIAGNOSIS — E039 Hypothyroidism, unspecified: Secondary | ICD-10-CM

## 2013-12-21 DIAGNOSIS — Z23 Encounter for immunization: Secondary | ICD-10-CM | POA: Diagnosis not present

## 2013-12-21 DIAGNOSIS — E785 Hyperlipidemia, unspecified: Secondary | ICD-10-CM

## 2013-12-21 LAB — LIPID PANEL
Cholesterol: 161 mg/dL (ref 0–200)
HDL: 45.1 mg/dL (ref >39.00)
LDL Cholesterol: 101 mg/dL — ABNORMAL HIGH (ref 0–99)
NONHDL: 115.9
Total CHOL/HDL Ratio: 4
Triglycerides: 74 mg/dL (ref 0.0–149.0)
VLDL: 14.8 mg/dL (ref 0.0–40.0)

## 2013-12-21 LAB — HEPATIC FUNCTION PANEL
ALK PHOS: 60 U/L (ref 39–117)
ALT: 25 U/L (ref 0–53)
AST: 28 U/L (ref 0–37)
Albumin: 3.9 g/dL (ref 3.5–5.2)
BILIRUBIN TOTAL: 1.8 mg/dL — AB (ref 0.2–1.2)
Bilirubin, Direct: 0.3 mg/dL (ref 0.0–0.3)
Total Protein: 7.3 g/dL (ref 6.0–8.3)

## 2013-12-21 LAB — BASIC METABOLIC PANEL
BUN: 17 mg/dL (ref 6–23)
CO2: 25 mEq/L (ref 19–32)
Calcium: 9.1 mg/dL (ref 8.4–10.5)
Chloride: 106 mEq/L (ref 96–112)
Creatinine, Ser: 1 mg/dL (ref 0.4–1.5)
GFR: 77.38 mL/min (ref >60.00)
Glucose, Bld: 101 mg/dL — ABNORMAL HIGH (ref 70–99)
Potassium: 4.1 mEq/L (ref 3.5–5.1)
SODIUM: 140 meq/L (ref 135–145)

## 2013-12-21 LAB — TSH: TSH: 3.37 u[IU]/mL (ref 0.35–4.50)

## 2013-12-21 MED ORDER — TETANUS-DIPHTHERIA TOXOIDS TD 5-2 LFU IM INJ: 0.5000 mL | INJECTION | Freq: Once | INTRAMUSCULAR | Status: DC

## 2013-12-21 NOTE — Assessment & Plan Note (Signed)
LDL goal <70 On statin Check lipids annually and titrate as needed to control

## 2013-12-21 NOTE — Progress Notes (Signed)
Subjective:    Patient ID: [name] [name], male    DOB: [birth year], 73 y.o.   MRN: [id number]  HPI  New patient to me to establish with primary care physician Reviewed chronic medical issues and interval medical events  Past Medical History  Diagnosis Date  . Hypothyroidism   . CAD (coronary artery disease) 2005    PCI ([facility])  . HLD (hyperlipidemia)   . Myocardial infarction, nontransmural, subendocardial 2005  . Atrial fibrillation     Paroxysmal, chronic anticoag  . Psoriasis   . (HFpEF) heart failure with preserved ejection fraction   . Kidney stones   . Seizures     x1 in the 1970  . BCC (basal cell carcinoma), lip 09/2013  . HTN (hypertension)     Review of Systems  Constitutional: Negative for fever, fatigue and unexpected weight change.  Respiratory: Negative for cough and shortness of breath.   Cardiovascular: Negative for chest pain, palpitations and leg swelling (chronic RLE w/o change).  Skin: Positive for rash (no change). Negative for wound.       Objective:   Physical Exam  BP 128/78  Pulse 50  Temp(Src) 97.6 F (36.4 C) (Oral)  Ht 5\' 7"  (1.702 m)  Wt 218 lb 12 oz (99.224 kg)  BMI 34.25 kg/m2  SpO2 95% Wt Readings from Last 3 Encounters:  12/21/13 218 lb 12 oz (99.224 kg)  11/01/13 219 lb (99.338 kg)  09/21/13 217 lb (98.431 kg)   Constitutional: he is obese, appears well-developed and well-nourished. No distress. wife at side Neck: Normal range of motion. Neck supple. No JVD present. No thyromegaly present.  Cardiovascular: Normal rate, regular rhythm and normal heart sounds.  No murmur heard. No BLE edema. Pulmonary/Chest: Effort normal and breath sounds normal. No respiratory distress. he has no wheezes.  Skin: psoriasis affecting face and right lower extremity, chronic Psychiatric: he has a normal mood and affect. His behavior is normal. Judgment and thought content normal.   Lab Results  Component Value Date   WBC 7.8 11/01/2013   HGB 12.9* 11/01/2013   HCT 39.2 11/01/2013   PLT 345.0 11/01/2013   GLUCOSE 96 09/19/2013   NA 139 09/19/2013   K 4.3 09/19/2013   CL 102 09/19/2013   CREATININE 0.88 09/19/2013   BUN 14 09/19/2013   CO2 25 09/19/2013   INR 2.0 12/11/2013    Dg Chest 2 View  09/19/2013   CLINICAL DATA:  Hypertension.  EXAM: CHEST  2 VIEW  COMPARISON:  None.  FINDINGS: The heart size and mediastinal contours are within normal limits. No pneumothorax or pleural effusion is noted. Right lung is clear. Minimal linear density is noted laterally in left lung base most consistent with scarring. No acute pulmonary disease is noted. Moderate wedge compression deformity of mid thoracic vertebral body is noted most consistent with old compression fracture.  IMPRESSION: No acute cardiopulmonary abnormality seen.   Electronically Signed   By: Sabino Dick M.D.   On: 09/19/2013 13:21       Assessment & Plan:   Problem List Items Addressed This Visit   Atrial fibrillation     Normal sinus rhythm today On anticoagulation, rate control with carvedilol    Relevant Orders      TSH   CAD (coronary artery disease) - Primary     Subendocardial MI with PCI 2005 while living in [place] regularly with cardiology for same No anginal symptoms Check lipids, titrate to LDL goal less than  70 The current medical regimen is effective;  continue present plan and medications.     Relevant Orders      Basic metabolic panel      Lipid panel      Hepatic function panel   HLD (hyperlipidemia)     LDL goal <70 On statin Check lipids annually and titrate as needed to control    Relevant Orders      Lipid panel   HTN (hypertension)      BP Readings from Last 3 Encounters:  12/21/13 128/78  11/01/13 140/90  10/03/13 137/83   The current medical regimen is effective;  continue present plan and medications.     Relevant Orders      Basic metabolic panel   Hypothyroidism      On levothyroxin replacement Check TSH  and adjust dosing as needed No results found for this basename: TSH       Relevant Orders      TSH    Other Visit Diagnoses   Need for prophylactic vaccination and inoculation against influenza        Relevant Orders       Flu vaccine HIGH DOSE PF (Fluzone Tri High dose) (Completed)

## 2013-12-21 NOTE — Assessment & Plan Note (Signed)
BP Readings from Last 3 Encounters:  12/21/13 128/78  11/01/13 140/90  10/03/13 137/83   The current medical regimen is effective;  continue present plan and medications.

## 2013-12-21 NOTE — Progress Notes (Signed)
Pre visit review using our clinic review tool, if applicable. No additional management support is needed unless otherwise documented below in the visit note. 

## 2013-12-21 NOTE — Patient Instructions (Signed)
It was good to see you today.  We have reviewed your prior records including labs and tests today  Flu shot and tetanus immunizations updated today  Test(s) ordered today. Your results will be released to [facility] (or called to you) after review, usually within 72hours after test completion. If any changes need to be made, you will be notified at that same time.  Medications reviewed and updated, no changes recommended at this time. Refill on medication(s) as discussed today.  Please schedule followup in 6 months, call sooner if problems.

## 2013-12-21 NOTE — Assessment & Plan Note (Signed)
On levothyroxin replacement Check TSH and adjust dosing as needed No results found for this basename: TSH

## 2013-12-21 NOTE — Assessment & Plan Note (Signed)
Normal sinus rhythm today On anticoagulation, rate control with carvedilol

## 2013-12-21 NOTE — Assessment & Plan Note (Signed)
Subendocardial MI with PCI 2005 while living in [facility] regularly with cardiology for same No anginal symptoms Check lipids, titrate to LDL goal less than 70 The current medical regimen is effective;  continue present plan and medications.

## 2013-12-22 ENCOUNTER — Telehealth: Payer: Self-pay | Admitting: Internal Medicine

## 2013-12-22 NOTE — Telephone Encounter (Signed)
emmi emailed °

## 2014-01-14 ENCOUNTER — Encounter (HOSPITAL_COMMUNITY): Payer: Self-pay | Admitting: *Deleted

## 2014-01-14 ENCOUNTER — Emergency Department (HOSPITAL_COMMUNITY): Payer: Medicare Other

## 2014-01-14 ENCOUNTER — Emergency Department (HOSPITAL_COMMUNITY)
Admission: EM | Admit: 2014-01-14 | Discharge: 2014-01-14 | Disposition: A | Discharge status: Home / Self Care | Payer: Medicare Other | Attending: Emergency Medicine | Admitting: Emergency Medicine

## 2014-01-14 DIAGNOSIS — L089 Local infection of the skin and subcutaneous tissue, unspecified: Secondary | ICD-10-CM | POA: Insufficient documentation

## 2014-01-14 DIAGNOSIS — Z87891 Personal history of nicotine dependence: Secondary | ICD-10-CM | POA: Insufficient documentation

## 2014-01-14 DIAGNOSIS — E039 Hypothyroidism, unspecified: Secondary | ICD-10-CM | POA: Insufficient documentation

## 2014-01-14 DIAGNOSIS — Z8639 Personal history of other endocrine, nutritional and metabolic disease: Secondary | ICD-10-CM | POA: Diagnosis not present

## 2014-01-14 DIAGNOSIS — I480 Paroxysmal atrial fibrillation: Secondary | ICD-10-CM | POA: Diagnosis not present

## 2014-01-14 DIAGNOSIS — S61239A Puncture wound without foreign body of unspecified finger without damage to nail, initial encounter: Secondary | ICD-10-CM | POA: Diagnosis not present

## 2014-01-14 DIAGNOSIS — Z85828 Personal history of other malignant neoplasm of skin: Secondary | ICD-10-CM | POA: Diagnosis not present

## 2014-01-14 DIAGNOSIS — L409 Psoriasis, unspecified: Secondary | ICD-10-CM | POA: Diagnosis not present

## 2014-01-14 DIAGNOSIS — Z792 Long term (current) use of antibiotics: Secondary | ICD-10-CM | POA: Insufficient documentation

## 2014-01-14 DIAGNOSIS — I2510 Atherosclerotic heart disease of native coronary artery without angina pectoris: Secondary | ICD-10-CM | POA: Insufficient documentation

## 2014-01-14 DIAGNOSIS — Z87442 Personal history of urinary calculi: Secondary | ICD-10-CM | POA: Insufficient documentation

## 2014-01-14 DIAGNOSIS — Z7901 Long term (current) use of anticoagulants: Secondary | ICD-10-CM | POA: Diagnosis not present

## 2014-01-14 DIAGNOSIS — I252 Old myocardial infarction: Secondary | ICD-10-CM | POA: Insufficient documentation

## 2014-01-14 DIAGNOSIS — I509 Heart failure, unspecified: Secondary | ICD-10-CM | POA: Diagnosis not present

## 2014-01-14 DIAGNOSIS — T1490XA Injury, unspecified, initial encounter: Secondary | ICD-10-CM

## 2014-01-14 DIAGNOSIS — S6991XA Unspecified injury of right wrist, hand and finger(s), initial encounter: Secondary | ICD-10-CM | POA: Diagnosis present

## 2014-01-14 DIAGNOSIS — Z8669 Personal history of other diseases of the nervous system and sense organs: Secondary | ICD-10-CM | POA: Diagnosis not present

## 2014-01-14 DIAGNOSIS — I110 Hypertensive heart disease with heart failure: Secondary | ICD-10-CM | POA: Diagnosis not present

## 2014-01-14 DIAGNOSIS — L02413 Cutaneous abscess of right upper limb: Secondary | ICD-10-CM | POA: Diagnosis not present

## 2014-01-14 DIAGNOSIS — Z79899 Other long term (current) drug therapy: Secondary | ICD-10-CM | POA: Insufficient documentation

## 2014-01-14 DIAGNOSIS — I10 Essential (primary) hypertension: Secondary | ICD-10-CM | POA: Diagnosis not present

## 2014-01-14 MED ORDER — CLINDAMYCIN HCL 300 MG PO CAPS: 300.0000 mg | ORAL_CAPSULE | Freq: Four times a day (QID) | ORAL | Status: DC

## 2014-01-14 MED ORDER — LIDOCAINE HCL (PF) 1 % IJ SOLN
30.0000 mL | Freq: Once | INTRAMUSCULAR | Status: AC
  Administered 2014-01-14: 30 mL
  Filled 2014-01-14: qty 30

## 2014-01-14 MED ORDER — HYDROCODONE-ACETAMINOPHEN 5-325 MG PO TABS: 1.0000 | ORAL_TABLET | ORAL | Status: DC | PRN

## 2014-01-14 NOTE — ED Provider Notes (Signed)
CSN: [id number]     Arrival date & time 01/14/14  0502 History   First MD Initiated Contact with Patient 01/14/14 [phone]     Chief Complaint  Patient presents with  . Finger Injury     HPI Patient reports he was working on [employer] last week and had a splinter in his right index finger.  He removed the splinter.  He developed a small amount of redness and last night he was able to squeeze some pus out of his right index finger.  He awoke this morning to redness of the full or surface of his right index finger isolated to the middle phalanx.  He denies pain with flexion of his right finger.  He has no tenderness or spreading redness proximal to the focal area of swelling.  No drainage.  No fevers or chills.  Patient is not a diabetic.  He is on Coumadin   Past Medical History  Diagnosis Date  . Hypothyroidism   . CAD (coronary artery disease) 2005    PCI ([facility])  . HLD (hyperlipidemia)   . Myocardial infarction, nontransmural, subendocardial 2005  . Atrial fibrillation     Paroxysmal, chronic anticoag  . Psoriasis   . (HFpEF) heart failure with preserved ejection fraction   . Kidney stones   . Seizures     x1 in the 1970s, no AEDs since mid 1970s  . BCC (basal cell carcinoma), lip 09/2013  . HTN (hypertension)    Past Surgical History  Procedure Laterality Date  . Hernia repair  2008  . Skin cancer excision  2009    right upper lip/nares  . Tibia fracture surgery  1982    right  . Kidney stone surgery  1987  . Cardiac catheterization  2005    RCA: 99% mid. RPL: 70% ostial,   . Coronary angioplasty with stent placement  2005    RCA and RPL (DES)  . Lipoma excision Right 09/21/2013    Procedure: LIP BIOPSY WITH FROZEN SECTION AND WEDGE EXCISION OF RIGHT UPPER LIP ;  Surgeon: Izora Gala, MD;  Location: [facility];  Service: ENT;  Laterality: Right;  . Basal cell carcinoma excision  09/2013   Family History  Problem Relation Age of Onset  . Heart failure    . Heart attack  Father   . Hypertension Father   . Atrial fibrillation Mother   . Atrial fibrillation Sister     s/p RFA  . Atrial fibrillation Brother     s/p RFA   History  Substance Use Topics  . Smoking status: Former Smoker -- 0.30 packs/day for 30 years    Quit date: 01/14/1993  . Smokeless tobacco: Never Used  . Alcohol Use: 1.2 oz/week    2 Cans of beer per week    Review of Systems  All other systems reviewed and are negative.     Allergies  Review of patient's allergies indicates no known allergies.  Home Medications   Prior to Admission medications   Medication Sig Start Date End Date Taking? Authorizing Provider  carvedilol (COREG) 12.5 MG tablet Take 1 tablet (12.5 mg total) by mouth 2 (two) times daily with a meal. 11/01/13  Yes Larey Dresser, MD  Glucosamine-Chondroit-Vit C-Mn (GLUCOSAMINE 1500 COMPLEX PO) Take 1 tablet by mouth 2 (two) times daily.    Yes Historical Provider, MD  levothyroxine (SYNTHROID, LEVOTHROID) 50 MCG tablet Take 50 mcg by mouth daily.     Yes Historical Provider, MD  nitroGLYCERIN (  NITROSTAT) 0.4 MG SL tablet Place 0.4 mg under the tongue every 5 (five) minutes as needed for chest pain.   Yes Historical Provider, MD  simvastatin (ZOCOR) 40 MG tablet Take 1 tablet (40 mg total) by mouth daily. 11/01/13  Yes Larey Dresser, MD  warfarin (COUMADIN) 5 MG tablet Take as directed by anticoagulation clinic Patient taking differently: Take 5 mg by mouth daily. Take as directed by anticoagulation clinic 11/01/13  Yes Larey Dresser, MD  clindamycin (CLEOCIN) 300 MG capsule Take 1 capsule (300 mg total) by mouth 4 (four) times daily. 01/14/14   Hoy Morn, MD  HYDROcodone-acetaminophen (NORCO/VICODIN) 5-325 MG per tablet Take 1 tablet by mouth every 4 (four) hours as needed for moderate pain. 01/14/14   Hoy Morn, MD   BP 147/90 mmHg  Pulse 60  Temp(Src) 97.7 F (36.5 C) (Oral)  Resp 20  SpO2 100% Physical Exam  Constitutional: He is oriented to  person, place, and time. He appears well-developed and well-nourished.  HENT:  Head: Normocephalic.  Eyes: EOM are normal.  Neck: Normal range of motion.  Pulmonary/Chest: Effort normal.  Abdominal: He exhibits no distension.  Musculoskeletal: Normal range of motion.  Focus swelling and erythema at the level of the middle phalanx of the right index finger.  There is no pain with range of motion of the right finger.  Normal flexion of the right finger.  No tenderness along the flexor tendon sheath distally or proximally.  No spreading erythema past the level of the middle phalanx.  No frank fluctuance.  Mild induration.  Neurological: He is alert and oriented to person, place, and time.  Psychiatric: He has a normal mood and affect.  Nursing note and vitals reviewed.   ED Course  Procedures (including critical care time)  INCISION AND DRAINAGE Performed by: Hoy Morn Consent: Verbal consent obtained. Risks and benefits: risks, benefits and alternatives were discussed Time out performed prior to procedure Type: abscess Body area: middle phalanx of right index finger, volar surface Anesthesia: local infiltration Incision was made with a scalpel. Local anesthetic: lidocaine 2% without epinephrine (nerve block-see note) Anesthetic total: 7 ml Complexity: complex Blunt dissection to break up loculations Drainage: purulent Drainage amount: none Packing material: none Patient tolerance: Patient tolerated the procedure well with no immediate complications.  NERVE BLOCK Performed by: Hoy Morn Consent: Verbal consent obtained. Required items: required blood products, implants, devices, and special equipment available Time out: Immediately prior to procedure a "time out" was called to verify the correct patient, procedure, equipment, support staff and site/side marked as required. Indication: incision and drainage Nerve block body site: digital nerves of right index  finger Preparation: Patient was prepped and draped in the usual sterile fashion. Needle gauge: 24 G Location technique: anatomical landmarks Local anesthetic: lidocaine 2% without epinephrine Anesthetic total: 7 ml Outcome: pain improved Patient tolerance: Patient tolerated the procedure well with no immediate complications.     Labs Review Labs Reviewed - No data to display  Imaging Review No results found.   EKG Interpretation None      MDM   Final diagnoses:  Finger infection    I spoke with our hand surgeon Dr. Caralyn Guile who recommended simple incision and drainage at the bedside at this time and asked that if the patient's symptoms were to worsen that the patient return to the emergency department.  Placed on clindamycin.  Patient prefers to get his clindamycin at the pharmacy now rather than receive this in  the emergency department.  I suspect the majority of the pus was removed yesterday by the patient and the majority of what I'm seeing at this time is induration and cellulitis.  Patient understands that this could get worse.    Hoy Morn, MD 01/14/14 [phone]

## 2014-01-14 NOTE — ED Notes (Signed)
The pt has a red and swollen rt index finger.  He has a splinter from a flooring  On Thursday.  He has been trying to get it out.  Red swollen and pain has increased

## 2014-01-14 NOTE — ED Notes (Signed)
MD at bedside. 

## 2014-01-15 ENCOUNTER — Ambulatory Visit (INDEPENDENT_AMBULATORY_CARE_PROVIDER_SITE_OTHER): Payer: Medicare Other

## 2014-01-15 DIAGNOSIS — I4891 Unspecified atrial fibrillation: Secondary | ICD-10-CM | POA: Diagnosis not present

## 2014-01-15 DIAGNOSIS — Z7901 Long term (current) use of anticoagulants: Secondary | ICD-10-CM

## 2014-01-15 DIAGNOSIS — Z5181 Encounter for therapeutic drug level monitoring: Secondary | ICD-10-CM | POA: Diagnosis not present

## 2014-01-15 LAB — POCT INR: INR: 2.2

## 2014-01-22 ENCOUNTER — Other Ambulatory Visit: Payer: Self-pay

## 2014-01-22 DIAGNOSIS — I480 Paroxysmal atrial fibrillation: Secondary | ICD-10-CM

## 2014-01-22 MED ORDER — CARVEDILOL 12.5 MG PO TABS: 12.5000 mg | ORAL_TABLET | Freq: Two times a day (BID) | ORAL | Status: DC

## 2014-01-22 MED ORDER — LEVOTHYROXINE SODIUM 50 MCG PO TABS: 50.0000 ug | ORAL_TABLET | Freq: Every day | ORAL | Status: DC

## 2014-02-26 ENCOUNTER — Ambulatory Visit (INDEPENDENT_AMBULATORY_CARE_PROVIDER_SITE_OTHER): Payer: Medicare Other | Admitting: Surgery

## 2014-02-26 DIAGNOSIS — Z7901 Long term (current) use of anticoagulants: Secondary | ICD-10-CM | POA: Diagnosis not present

## 2014-02-26 DIAGNOSIS — Z5181 Encounter for therapeutic drug level monitoring: Secondary | ICD-10-CM

## 2014-02-26 DIAGNOSIS — I4891 Unspecified atrial fibrillation: Secondary | ICD-10-CM | POA: Diagnosis not present

## 2014-02-26 LAB — POCT INR: INR: 2.3

## 2014-04-11 ENCOUNTER — Ambulatory Visit (INDEPENDENT_AMBULATORY_CARE_PROVIDER_SITE_OTHER): Payer: Medicare Other | Admitting: *Deleted

## 2014-04-11 DIAGNOSIS — Z7901 Long term (current) use of anticoagulants: Secondary | ICD-10-CM | POA: Diagnosis not present

## 2014-04-11 DIAGNOSIS — I4891 Unspecified atrial fibrillation: Secondary | ICD-10-CM

## 2014-04-11 DIAGNOSIS — Z5181 Encounter for therapeutic drug level monitoring: Secondary | ICD-10-CM | POA: Diagnosis not present

## 2014-04-11 LAB — POCT INR: INR: 1.9

## 2014-05-16 ENCOUNTER — Ambulatory Visit (INDEPENDENT_AMBULATORY_CARE_PROVIDER_SITE_OTHER): Payer: Medicare Other | Admitting: *Deleted

## 2014-05-16 DIAGNOSIS — I4891 Unspecified atrial fibrillation: Secondary | ICD-10-CM | POA: Diagnosis not present

## 2014-05-16 DIAGNOSIS — Z5181 Encounter for therapeutic drug level monitoring: Secondary | ICD-10-CM | POA: Diagnosis not present

## 2014-05-16 DIAGNOSIS — Z7901 Long term (current) use of anticoagulants: Secondary | ICD-10-CM | POA: Diagnosis not present

## 2014-05-16 LAB — POCT INR: INR: 1.8

## 2014-05-30 ENCOUNTER — Ambulatory Visit (INDEPENDENT_AMBULATORY_CARE_PROVIDER_SITE_OTHER): Payer: Medicare Other | Admitting: *Deleted

## 2014-05-30 DIAGNOSIS — Z7901 Long term (current) use of anticoagulants: Secondary | ICD-10-CM | POA: Diagnosis not present

## 2014-05-30 DIAGNOSIS — Z5181 Encounter for therapeutic drug level monitoring: Secondary | ICD-10-CM | POA: Diagnosis not present

## 2014-05-30 DIAGNOSIS — I4891 Unspecified atrial fibrillation: Secondary | ICD-10-CM | POA: Diagnosis not present

## 2014-05-30 LAB — POCT INR: INR: 2

## 2014-06-21 ENCOUNTER — Ambulatory Visit (INDEPENDENT_AMBULATORY_CARE_PROVIDER_SITE_OTHER): Payer: Medicare Other | Admitting: *Deleted

## 2014-06-21 DIAGNOSIS — Z5181 Encounter for therapeutic drug level monitoring: Secondary | ICD-10-CM | POA: Diagnosis not present

## 2014-06-21 DIAGNOSIS — Z7901 Long term (current) use of anticoagulants: Secondary | ICD-10-CM

## 2014-06-21 DIAGNOSIS — I4891 Unspecified atrial fibrillation: Secondary | ICD-10-CM

## 2014-06-21 LAB — POCT INR: INR: 2.5

## 2014-06-25 ENCOUNTER — Ambulatory Visit (INDEPENDENT_AMBULATORY_CARE_PROVIDER_SITE_OTHER): Payer: Medicare Other | Admitting: Internal Medicine

## 2014-06-25 ENCOUNTER — Encounter: Payer: Self-pay | Admitting: Internal Medicine

## 2014-06-25 ENCOUNTER — Ambulatory Visit: Payer: Self-pay | Admitting: Internal Medicine

## 2014-06-25 VITALS — BP 122/72 | HR 54 | Temp 98.0°F | Resp 18 | Ht 68.0 in | Wt 215.4 lb

## 2014-06-25 DIAGNOSIS — I10 Essential (primary) hypertension: Secondary | ICD-10-CM | POA: Diagnosis not present

## 2014-06-25 DIAGNOSIS — E039 Hypothyroidism, unspecified: Secondary | ICD-10-CM

## 2014-06-25 DIAGNOSIS — I2510 Atherosclerotic heart disease of native coronary artery without angina pectoris: Secondary | ICD-10-CM

## 2014-06-25 NOTE — Patient Instructions (Signed)
We will see you back in about 1 year. We can do the blood work then.   If you have any new problems or questions please call the office.  Health Maintenance A healthy lifestyle and preventative care can promote health and wellness.  Maintain regular health, dental, and eye exams.  Eat a healthy diet. Foods like vegetables, fruits, whole grains, low-fat dairy products, and lean protein foods contain the nutrients you need and are low in calories. Decrease your intake of foods high in solid fats, added sugars, and salt. Get information about a proper diet from your health care provider, if necessary.  Regular physical exercise is one of the most important things you can do for your health. Most adults should get at least 150 minutes of moderate-intensity exercise (any activity that increases your heart rate and causes you to sweat) each week. In addition, most adults need muscle-strengthening exercises on 2 or more days a week.   Maintain a healthy weight. The body mass index (BMI) is a screening tool to identify possible weight problems. It provides an estimate of body fat based on height and weight. Your health care provider can find your BMI and can help you achieve or maintain a healthy weight. For males 20 years and older:  A BMI below 18.5 is considered underweight.  A BMI of 18.5 to 24.9 is normal.  A BMI of 25 to 29.9 is considered overweight.  A BMI of 30 and above is considered obese.  Maintain normal blood lipids and cholesterol by exercising and minimizing your intake of saturated fat. Eat a balanced diet with plenty of fruits and vegetables. Blood tests for lipids and cholesterol should begin at age 46 and be repeated every 5 years. If your lipid or cholesterol levels are high, you are over age 55, or you are at high risk for heart disease, you may need your cholesterol levels checked more frequently.Ongoing high lipid and cholesterol levels should be treated with medicines if  diet and exercise are not working.  If you smoke, find out from your health care provider how to quit. If you do not use tobacco, do not start.  Lung cancer screening is recommended for adults aged 31-80 years who are at high risk for developing lung cancer because of a history of smoking. A yearly low-dose CT scan of the lungs is recommended for people who have at least a 30-pack-year history of smoking and are current smokers or have quit within the past 15 years. A pack year of smoking is smoking an average of 1 pack of cigarettes a day for 1 year (for example, a 30-pack-year history of smoking could mean smoking 1 pack a day for 30 years or 2 packs a day for 15 years). Yearly screening should continue until the smoker has stopped smoking for at least 15 years. Yearly screening should be stopped for people who develop a health problem that would prevent them from having lung cancer treatment.  If you choose to drink alcohol, do not have more than 2 drinks per day. One drink is considered to be 12 oz (360 mL) of beer, 5 oz (150 mL) of wine, or 1.5 oz (45 mL) of liquor.  Avoid the use of street drugs. Do not share needles with anyone. Ask for help if you need support or instructions about stopping the use of drugs.  High blood pressure causes heart disease and increases the risk of stroke. Blood pressure should be checked at least every 1-2  years. Ongoing high blood pressure should be treated with medicines if weight loss and exercise are not effective.  If you are 72 years old, ask your health care provider if you should take aspirin to prevent heart disease.  Diabetes screening involves taking a blood sample to check your fasting blood sugar level. This should be done once every 3 years after age 22 if you are at a normal weight and without risk factors for diabetes. Testing should be considered at a younger age or be carried out more frequently if you are overweight and have at least 1 risk  factor for diabetes.  Colorectal cancer can be detected and often prevented. Most routine colorectal cancer screening begins at the age of 72 and continues through age 72. However, your health care provider may recommend screening at an earlier age if you have risk factors for colon cancer. On a yearly basis, your health care provider may provide home test kits to check for hidden blood in the stool. A small camera at the end of a tube may be used to directly examine the colon (sigmoidoscopy or colonoscopy) to detect the earliest forms of colorectal cancer. Talk to your health care provider about this at age 72 when routine screening begins. A direct exam of the colon should be repeated every 5-10 years through age 72, unless early forms of precancerous polyps or small growths are found.  People who are at an increased risk for hepatitis B should be screened for this virus. You are considered at high risk for hepatitis B if:  You were born in a country where hepatitis B occurs often. Talk with your health care provider about which countries are considered high risk.  Your parents were born in a high-risk country and you have not received a shot to protect against hepatitis B (hepatitis B vaccine).  You have HIV or AIDS.  You use needles to inject street drugs.  You live with, or have sex with, someone who has hepatitis B.  You are a man who has sex with other men (MSM).  You get hemodialysis treatment.  You take certain medicines for conditions like cancer, organ transplantation, and autoimmune conditions.  Hepatitis C blood testing is recommended for all people born from [birth year] through 1963 and any individual with known risk factors for hepatitis C.  Healthy men should no longer receive prostate-specific antigen (PSA) blood tests as part of routine cancer screening. Talk to your health care provider about prostate cancer screening.  Testicular cancer screening is not recommended for  adolescents or adult males who have no symptoms. Screening includes self-exam, a health care provider exam, and other screening tests. Consult with your health care provider about any symptoms you have or any concerns you have about testicular cancer.  Practice safe sex. Use condoms and avoid high-risk sexual practices to reduce the spread of sexually transmitted infections (STIs).  You should be screened for STIs, including gonorrhea and chlamydia if:  You are sexually active and are younger than 24 years.  You are older than 24 years, and your health care provider tells you that you are at risk for this type of infection.  Your sexual activity has changed since you were last screened, and you are at an increased risk for chlamydia or gonorrhea. Ask your health care provider if you are at risk.  If you are at risk of being infected with HIV, it is recommended that you take a prescription medicine daily to prevent HIV infection.  This is called pre-exposure prophylaxis (PrEP). You are considered at risk if:  You are a man who has sex with other men (MSM).  You are a heterosexual man who is sexually active with multiple partners.  You take drugs by injection.  You are sexually active with a partner who has HIV.  Talk with your health care provider about whether you are at high risk of being infected with HIV. If you choose to begin PrEP, you should first be tested for HIV. You should then be tested every 3 months for as long as you are taking PrEP.  Use sunscreen. Apply sunscreen liberally and repeatedly throughout the day. You should seek shade when your shadow is shorter than you. Protect yourself by wearing long sleeves, pants, a wide-brimmed hat, and sunglasses year round whenever you are outdoors.  Tell your health care provider of new moles or changes in moles, especially if there is a change in shape or color. Also, tell your health care provider if a mole is larger than the size of a  pencil eraser.  A one-time screening for abdominal aortic aneurysm (AAA) and surgical repair of large AAAs by ultrasound is recommended for men aged 71-75 years who are current or former smokers.  Stay current with your vaccines (immunizations). Document Released: 08/29/2007 Document Revised: 03/07/2013 Document Reviewed: 07/28/2010 [facility] Patient Information 2015 [place], [place]. This information is not intended to replace advice given to you by your health care provider. Make sure you discuss any questions you have with your health care provider.

## 2014-06-25 NOTE — Progress Notes (Signed)
Pre visit review using our clinic review tool, if applicable. No additional management support is needed unless otherwise documented below in the visit note. 

## 2014-06-25 NOTE — Assessment & Plan Note (Signed)
No ASA daily as he is on coumadin, taking coreg and simvastatin. No anginal pains and is exercising 2-3 miles per day.

## 2014-06-25 NOTE — Assessment & Plan Note (Signed)
Patient has been stable on levothyroxine 50 mcg daily for many years. Does not wish to check labs today and okay with that. Will check at next visit. No signs of over or under replacement on exam today.

## 2014-06-25 NOTE — Assessment & Plan Note (Signed)
BP controlled on coreg BID, no need for blood draw today. BP 122/72.

## 2014-06-25 NOTE — Progress Notes (Signed)
   Subjective:    Patient ID: [name] [name], male    DOB: [birth year], 74 y.o.   MRN: [id number]  HPI The patinet is a 74 YO man who is coming in to follow up about his hypertension (well controlled on coreg, no complications), thyroid (doing well on levothyroxine, stable for years), CAD (increased his exercise lately and no angina, takes warfarin, coreg, simvastatin daily, no complications). No new complaints.   Review of Systems  Constitutional: Negative for fever, activity change, appetite change, fatigue and unexpected weight change.  Respiratory: Negative for cough, chest tightness, shortness of breath and wheezing.   Cardiovascular: Negative for chest pain, palpitations and leg swelling.  Gastrointestinal: Negative for abdominal pain, diarrhea, constipation and abdominal distention.  Musculoskeletal: Negative.   Skin: Negative.   Neurological: Negative.   Psychiatric/Behavioral: Negative.       Objective:   Physical Exam  Constitutional: He is oriented to person, place, and time. He appears well-developed and well-nourished.  HENT:  Head: Normocephalic and atraumatic.  Eyes: EOM are normal.  Neck: Normal range of motion.  Cardiovascular: Normal rate and regular rhythm.   Pulmonary/Chest: Effort normal and breath sounds normal. No respiratory distress. He has no wheezes.  Abdominal: Soft. Bowel sounds are normal.  Neurological: He is alert and oriented to person, place, and time.  Skin: Rash noted.  On his face, spot on his right arm which appears to be actinic keratosis (52mm circular).    Filed Vitals:   06/25/14 1259  BP: 122/72  Pulse: 54  Temp: 98 F (36.7 C)  TempSrc: Oral  Resp: 18  Height: 5\' 8"  (1.727 m)  Weight: 215 lb 6.4 oz (97.705 kg)  SpO2: 96%      Assessment & Plan:

## 2014-07-05 ENCOUNTER — Other Ambulatory Visit: Payer: Self-pay

## 2014-07-05 ENCOUNTER — Other Ambulatory Visit: Payer: Self-pay | Admitting: *Deleted

## 2014-07-05 MED ORDER — WARFARIN SODIUM 5 MG PO TABS: ORAL_TABLET | ORAL | Status: DC

## 2014-07-05 NOTE — Telephone Encounter (Signed)
Refill done as requested 

## 2014-07-16 ENCOUNTER — Other Ambulatory Visit: Payer: Self-pay | Admitting: Cardiology

## 2014-07-19 ENCOUNTER — Ambulatory Visit (INDEPENDENT_AMBULATORY_CARE_PROVIDER_SITE_OTHER): Payer: Medicare Other | Admitting: *Deleted

## 2014-07-19 DIAGNOSIS — Z7901 Long term (current) use of anticoagulants: Secondary | ICD-10-CM | POA: Diagnosis not present

## 2014-07-19 DIAGNOSIS — I4891 Unspecified atrial fibrillation: Secondary | ICD-10-CM

## 2014-07-19 DIAGNOSIS — Z5181 Encounter for therapeutic drug level monitoring: Secondary | ICD-10-CM

## 2014-07-19 LAB — POCT INR: INR: 2.1

## 2014-08-16 ENCOUNTER — Ambulatory Visit (INDEPENDENT_AMBULATORY_CARE_PROVIDER_SITE_OTHER): Payer: Medicare Other | Admitting: Pharmacist

## 2014-08-16 DIAGNOSIS — I4891 Unspecified atrial fibrillation: Secondary | ICD-10-CM | POA: Diagnosis not present

## 2014-08-16 DIAGNOSIS — Z7901 Long term (current) use of anticoagulants: Secondary | ICD-10-CM

## 2014-08-16 DIAGNOSIS — Z5181 Encounter for therapeutic drug level monitoring: Secondary | ICD-10-CM | POA: Diagnosis not present

## 2014-08-16 LAB — POCT INR: INR: 1.9

## 2014-09-13 ENCOUNTER — Ambulatory Visit (INDEPENDENT_AMBULATORY_CARE_PROVIDER_SITE_OTHER): Payer: Medicare Other | Admitting: Surgery

## 2014-09-13 DIAGNOSIS — Z7901 Long term (current) use of anticoagulants: Secondary | ICD-10-CM

## 2014-09-13 DIAGNOSIS — Z5181 Encounter for therapeutic drug level monitoring: Secondary | ICD-10-CM | POA: Diagnosis not present

## 2014-09-13 DIAGNOSIS — I4891 Unspecified atrial fibrillation: Secondary | ICD-10-CM | POA: Diagnosis not present

## 2014-09-13 LAB — POCT INR: INR: 1.8

## 2014-09-27 ENCOUNTER — Ambulatory Visit (INDEPENDENT_AMBULATORY_CARE_PROVIDER_SITE_OTHER): Payer: Medicare Other | Admitting: *Deleted

## 2014-09-27 DIAGNOSIS — Z5181 Encounter for therapeutic drug level monitoring: Secondary | ICD-10-CM | POA: Diagnosis not present

## 2014-09-27 DIAGNOSIS — Z7901 Long term (current) use of anticoagulants: Secondary | ICD-10-CM | POA: Diagnosis not present

## 2014-09-27 DIAGNOSIS — I4891 Unspecified atrial fibrillation: Secondary | ICD-10-CM | POA: Diagnosis not present

## 2014-09-27 LAB — POCT INR: INR: 2.9

## 2014-10-18 ENCOUNTER — Ambulatory Visit (INDEPENDENT_AMBULATORY_CARE_PROVIDER_SITE_OTHER): Payer: Medicare Other | Admitting: *Deleted

## 2014-10-18 DIAGNOSIS — Z5181 Encounter for therapeutic drug level monitoring: Secondary | ICD-10-CM

## 2014-10-18 DIAGNOSIS — Z7901 Long term (current) use of anticoagulants: Secondary | ICD-10-CM | POA: Diagnosis not present

## 2014-10-18 DIAGNOSIS — I4891 Unspecified atrial fibrillation: Secondary | ICD-10-CM | POA: Diagnosis not present

## 2014-10-18 LAB — POCT INR: INR: 3.4

## 2014-10-23 ENCOUNTER — Other Ambulatory Visit: Payer: Self-pay | Admitting: Internal Medicine

## 2014-11-08 ENCOUNTER — Ambulatory Visit (INDEPENDENT_AMBULATORY_CARE_PROVIDER_SITE_OTHER): Payer: Medicare Other | Admitting: *Deleted

## 2014-11-08 DIAGNOSIS — I4891 Unspecified atrial fibrillation: Secondary | ICD-10-CM | POA: Diagnosis not present

## 2014-11-08 DIAGNOSIS — Z5181 Encounter for therapeutic drug level monitoring: Secondary | ICD-10-CM

## 2014-11-08 DIAGNOSIS — Z7901 Long term (current) use of anticoagulants: Secondary | ICD-10-CM | POA: Diagnosis not present

## 2014-11-08 LAB — POCT INR: INR: 2.3

## 2014-12-06 ENCOUNTER — Ambulatory Visit (INDEPENDENT_AMBULATORY_CARE_PROVIDER_SITE_OTHER): Payer: Medicare Other | Admitting: *Deleted

## 2014-12-06 DIAGNOSIS — Z5181 Encounter for therapeutic drug level monitoring: Secondary | ICD-10-CM | POA: Diagnosis not present

## 2014-12-06 DIAGNOSIS — I4891 Unspecified atrial fibrillation: Secondary | ICD-10-CM

## 2014-12-06 DIAGNOSIS — Z7901 Long term (current) use of anticoagulants: Secondary | ICD-10-CM

## 2014-12-06 LAB — POCT INR: INR: 2.3

## 2014-12-18 ENCOUNTER — Other Ambulatory Visit: Payer: Self-pay | Admitting: Cardiology

## 2015-01-17 ENCOUNTER — Ambulatory Visit (INDEPENDENT_AMBULATORY_CARE_PROVIDER_SITE_OTHER): Payer: Medicare Other | Admitting: *Deleted

## 2015-01-17 DIAGNOSIS — Z7901 Long term (current) use of anticoagulants: Secondary | ICD-10-CM | POA: Diagnosis not present

## 2015-01-17 DIAGNOSIS — Z5181 Encounter for therapeutic drug level monitoring: Secondary | ICD-10-CM

## 2015-01-17 DIAGNOSIS — Z23 Encounter for immunization: Secondary | ICD-10-CM | POA: Diagnosis not present

## 2015-01-17 DIAGNOSIS — I4891 Unspecified atrial fibrillation: Secondary | ICD-10-CM

## 2015-01-17 LAB — POCT INR: INR: 3.2

## 2015-01-22 ENCOUNTER — Encounter: Payer: Self-pay | Admitting: Cardiology

## 2015-01-22 ENCOUNTER — Ambulatory Visit (INDEPENDENT_AMBULATORY_CARE_PROVIDER_SITE_OTHER): Payer: Medicare Other | Admitting: Cardiology

## 2015-01-22 VITALS — BP 124/80 | HR 55 | Ht 69.0 in | Wt 222.0 lb

## 2015-01-22 DIAGNOSIS — I2510 Atherosclerotic heart disease of native coronary artery without angina pectoris: Secondary | ICD-10-CM | POA: Diagnosis not present

## 2015-01-22 DIAGNOSIS — I480 Paroxysmal atrial fibrillation: Secondary | ICD-10-CM | POA: Diagnosis not present

## 2015-01-22 DIAGNOSIS — I4891 Unspecified atrial fibrillation: Secondary | ICD-10-CM | POA: Diagnosis not present

## 2015-01-22 LAB — LIPID PANEL
Cholesterol: 147 mg/dL (ref 125–200)
HDL: 45 mg/dL (ref >=40)
LDL Cholesterol: 82 mg/dL (ref <130)
Total CHOL/HDL Ratio: 3.3 Ratio (ref <=5.0)
Triglycerides: 98 mg/dL (ref <150)
VLDL: 20 mg/dL (ref <30)

## 2015-01-22 LAB — CBC WITH DIFFERENTIAL/PLATELET
BASOS ABS: 0.1 10*3/uL (ref 0.0–0.1)
BASOS PCT: 1 % (ref 0–1)
EOS ABS: 0.3 10*3/uL (ref 0.0–0.7)
Eosinophils Relative: 5 % (ref 0–5)
HCT: 41.9 % (ref 39.0–52.0)
Hemoglobin: 14.4 g/dL (ref 13.0–17.0)
Lymphocytes Relative: 23 % (ref 12–46)
Lymphs Abs: 1.3 10*3/uL (ref 0.7–4.0)
MCH: 32.9 pg (ref 26.0–34.0)
MCHC: 34.4 g/dL (ref 30.0–36.0)
MCV: 95.7 fL (ref 78.0–100.0)
MPV: 9.9 fL (ref 8.6–12.4)
Monocytes Absolute: 0.6 10*3/uL (ref 0.1–1.0)
Monocytes Relative: 10 % (ref 3–12)
NEUTROS PCT: 61 % (ref 43–77)
Neutro Abs: 3.5 10*3/uL (ref 1.7–7.7)
PLATELETS: 214 10*3/uL (ref 150–400)
RBC: 4.38 MIL/uL (ref 4.22–5.81)
RDW: 14.9 % (ref 11.5–15.5)
WBC: 5.7 10*3/uL (ref 4.0–10.5)

## 2015-01-22 LAB — BASIC METABOLIC PANEL
BUN: 15 mg/dL (ref 7–25)
CALCIUM: 9.3 mg/dL (ref 8.6–10.3)
CHLORIDE: 102 mmol/L (ref 98–110)
CO2: 26 mmol/L (ref 20–31)
Creat: 0.82 mg/dL (ref 0.70–1.18)
Glucose, Bld: 89 mg/dL (ref 65–99)
Potassium: 4.1 mmol/L (ref 3.5–5.3)
Sodium: 137 mmol/L (ref 135–146)

## 2015-01-22 MED ORDER — SIMVASTATIN 40 MG PO TABS: 40.0000 mg | ORAL_TABLET | Freq: Every day | ORAL | Status: DC

## 2015-01-22 MED ORDER — CARVEDILOL 12.5 MG PO TABS: 12.5000 mg | ORAL_TABLET | Freq: Two times a day (BID) | ORAL | Status: DC

## 2015-01-22 NOTE — Patient Instructions (Signed)
Medication Instructions:  No changes today.  Labwork: BMET/Lipid profile/CBCd today  Testing/Procedures: None today  Follow-Up: Your physician wants you to follow-up in: 6 months with Dr Aundra Dubin. (May 2017).  You will receive a reminder letter in the mail two months in advance. If you don't receive a letter, please call our office to schedule the follow-up appointment.        If you need a refill on your cardiac medications before your next appointment, please call your pharmacy.

## 2015-01-22 NOTE — Progress Notes (Signed)
Patient ID: [name] [name], male   DOB: 1941/02/24, 74 y.o.   MRN: [id number] PCP: Dr. Asa Lente  74 yo with history of CAD s/p PCI in 2005 and paroxysmal atrial fibrillation presents for cardiology followup.  He has been doing well.  He does yardwork and can walk up a hill with no dyspnea.  No chest pain.  No orthopnea or PND.  No syncope.   No stroke-like symptoms.  No tachypalpitations.  He is on coumadin for PAF.  BP is well-controlled. He has joined the [facility] is walking on the treadmill there for about 2 miles at a time without problems. No melena/BRBPR.   ECG: NSR, inferior Qs, anterolateral Qs  Labs (10/12): K 4.2, creatinine 1.0, LDL 70, HDL 54, AST/ALT ok. Labs (6/13): K 4.1, creatinine 0.86, HCT 40.8, LDL 69, HDL 49 Labs (7/14): K 3.8, creatinine 0.84, HCT 40.5, LDL 81, HDL 51, TSH normal Labs (1/15): HCT 42.8, LDL 74, HDL 48 Labs (7/15): K 4.3, creatinine 0.88 Labs (10/15): LDL 101, HDL 45, K 4.1, creatinine 1.0  PMH: 1. Gilberts syndrome 2. CAD: NSTEMI in 2005 with DES to mid RCA and ostial PLV ([place]).  Myoview in 1/12 with no ischemia or infarction. Echo (12/12): EF 55-60%, moderate LVH, grade II diastolic dysfunction.  3. Hypothyroidism 4. HTN  5. Nephrolithiasis 6. Hyperlipidemia 7. Atrial fibrillation: First noted in 12/12.   SH: Widower.  3 children.  Lives in [place].  Retired Chief Financial Officer.  Quit smoking in 1994.   FH: No premature CAD   Current Outpatient Prescriptions  Medication Sig Dispense Refill  . carvedilol (COREG) 12.5 MG tablet Take 1 tablet (12.5 mg total) by mouth 2 (two) times daily with a meal. 180 tablet 3  . Glucosamine-Chondroit-Vit C-Mn (GLUCOSAMINE 1500 COMPLEX PO) Take 1 tablet by mouth 2 (two) times daily.     Marland Kitchen levothyroxine (SYNTHROID, LEVOTHROID) 50 MCG tablet Take 1 tablet (50 mcg total) by mouth daily. 90 tablet 0  . NITROSTAT 0.4 MG SL tablet PLACE 1 TABLET UNDER TONGUE EVERY 5 MINUTES ASNEEDED 100 tablet 6  . simvastatin (ZOCOR) 40 MG  tablet Take 1 tablet (40 mg total) by mouth daily. 90 tablet 3  . warfarin (COUMADIN) 5 MG tablet Take as directed by anticoagulation clinic 120 tablet 1   No current facility-administered medications for this visit.    BP 124/80 mmHg  Pulse 55  Ht 5\' 9"  (1.753 m)  Wt 222 lb (100.699 kg)  BMI 32.77 kg/m2 General: NAD, overweight.  Neck: JVP 7 cm, no thyromegaly or thyroid nodule.  Lungs: Clear to auscultation bilaterally with normal respiratory effort. CV: Nondisplaced PMI.  Heart regular S1/S2, no S3/S4, no murmur.  No edema.  No carotid bruit.  Normal pedal pulses.  Abdomen: Soft, nontender, no hepatosplenomegaly, no distention.  Neurologic: Alert and oriented x 3.  Psych: Normal affect. Extremities: No clubbing or cyanosis.   Assessment/Plan: 1. Atrial fibrillation: Paroxysmal.  NSR today.  No symptomatic recurrence.  We have talked about NOAC in the past, he wants to continue warfarin, has not had any problems with it.  Check CBC today.  2. CAD: Stable, no ischemic symptoms.  Not on aspirin as he is on warfarin.  Continue statin, Coreg.  3. Hyperlipidemia: Check lipids today, goal LDL < 70.  If not at goal, would change to atorvastatin.   Followup in 1 year.   Loralie Champagne 01/22/2015

## 2015-01-25 ENCOUNTER — Other Ambulatory Visit: Payer: Self-pay | Admitting: Internal Medicine

## 2015-02-21 ENCOUNTER — Ambulatory Visit (INDEPENDENT_AMBULATORY_CARE_PROVIDER_SITE_OTHER): Payer: Medicare Other

## 2015-02-21 DIAGNOSIS — I4891 Unspecified atrial fibrillation: Secondary | ICD-10-CM

## 2015-02-21 DIAGNOSIS — Z7901 Long term (current) use of anticoagulants: Secondary | ICD-10-CM | POA: Diagnosis not present

## 2015-02-21 DIAGNOSIS — Z5181 Encounter for therapeutic drug level monitoring: Secondary | ICD-10-CM

## 2015-02-21 LAB — POCT INR: INR: 2.8

## 2015-04-04 ENCOUNTER — Ambulatory Visit (INDEPENDENT_AMBULATORY_CARE_PROVIDER_SITE_OTHER): Payer: Medicare Other | Admitting: *Deleted

## 2015-04-04 DIAGNOSIS — Z5181 Encounter for therapeutic drug level monitoring: Secondary | ICD-10-CM

## 2015-04-04 DIAGNOSIS — I4891 Unspecified atrial fibrillation: Secondary | ICD-10-CM

## 2015-04-04 DIAGNOSIS — Z7901 Long term (current) use of anticoagulants: Secondary | ICD-10-CM

## 2015-04-04 LAB — POCT INR: INR: 2.7

## 2015-04-18 ENCOUNTER — Other Ambulatory Visit: Payer: Self-pay | Admitting: Internal Medicine

## 2015-04-18 NOTE — Telephone Encounter (Signed)
Patient is establishing with you in April (dr leschber pt)---patients last tsh was oct/2015---are you ok with refilling or do you want patient to get lab before April appt---please advise, thanks

## 2015-05-16 ENCOUNTER — Ambulatory Visit (INDEPENDENT_AMBULATORY_CARE_PROVIDER_SITE_OTHER): Payer: Medicare Other | Admitting: *Deleted

## 2015-05-16 DIAGNOSIS — I4891 Unspecified atrial fibrillation: Secondary | ICD-10-CM

## 2015-05-16 DIAGNOSIS — Z5181 Encounter for therapeutic drug level monitoring: Secondary | ICD-10-CM

## 2015-05-16 DIAGNOSIS — Z7901 Long term (current) use of anticoagulants: Secondary | ICD-10-CM | POA: Diagnosis not present

## 2015-05-16 LAB — POCT INR: INR: 3.3

## 2015-05-21 ENCOUNTER — Other Ambulatory Visit: Payer: Self-pay | Admitting: Cardiology

## 2015-06-06 ENCOUNTER — Ambulatory Visit (INDEPENDENT_AMBULATORY_CARE_PROVIDER_SITE_OTHER): Payer: Medicare Other | Admitting: *Deleted

## 2015-06-06 DIAGNOSIS — I4891 Unspecified atrial fibrillation: Secondary | ICD-10-CM | POA: Diagnosis not present

## 2015-06-06 DIAGNOSIS — Z5181 Encounter for therapeutic drug level monitoring: Secondary | ICD-10-CM | POA: Diagnosis not present

## 2015-06-06 DIAGNOSIS — Z7901 Long term (current) use of anticoagulants: Secondary | ICD-10-CM

## 2015-06-06 LAB — POCT INR: INR: 2.9

## 2015-06-27 ENCOUNTER — Other Ambulatory Visit (INDEPENDENT_AMBULATORY_CARE_PROVIDER_SITE_OTHER): Payer: Medicare Other

## 2015-06-27 ENCOUNTER — Ambulatory Visit (INDEPENDENT_AMBULATORY_CARE_PROVIDER_SITE_OTHER): Payer: Medicare Other | Admitting: Internal Medicine

## 2015-06-27 ENCOUNTER — Encounter: Payer: Self-pay | Admitting: Internal Medicine

## 2015-06-27 VITALS — BP 124/88 | HR 76 | Temp 97.9°F | Resp 16 | Ht 69.0 in | Wt 228.1 lb

## 2015-06-27 DIAGNOSIS — Z0000 Encounter for general adult medical examination without abnormal findings: Secondary | ICD-10-CM

## 2015-06-27 DIAGNOSIS — E039 Hypothyroidism, unspecified: Secondary | ICD-10-CM | POA: Diagnosis not present

## 2015-06-27 DIAGNOSIS — I10 Essential (primary) hypertension: Secondary | ICD-10-CM | POA: Diagnosis not present

## 2015-06-27 DIAGNOSIS — Z85828 Personal history of other malignant neoplasm of skin: Secondary | ICD-10-CM

## 2015-06-27 LAB — T4, FREE: FREE T4: 0.98 ng/dL (ref 0.60–1.60)

## 2015-06-27 LAB — TSH: TSH: 3.31 u[IU]/mL (ref 0.35–4.50)

## 2015-06-27 NOTE — Patient Instructions (Signed)
We will check the labs today for the thyroid and send in the refills.   Keep up the good work with your health and keep using the smaller plate to help with serving sizes.   Serving Sizes A serving size is a measured amount of food or drink, such as one slice of bread, that has an associated nutrient content. Knowing the serving size of a food or drink can help you determine how much of that food you should consume.  WHAT IS THE SIZE OF ONE SERVING? The size of one healthy serving depends on the food or drink. To determine a serving size, read the food label. If the food or drink does not have a food label, try to find serving size information online. Or, use the following to estimate the size of one adult serving:  Grain 1 slice bread.  bagel.  cup pasta.  Vegetable  cup cooked or canned vegetables. 1 cup raw, leafy greens.  Fruit  cup canned fruit. 1 medium fruit.  cup dried fruit.  Meat and Other Protein Sources 1 oz meat, poultry, or fish.  cup cooked beans. 1 egg.  cup nuts or seeds. 1 Tbsp nut butter.  cup tofu or tempeh. 2 Tbsp hummus.  Dairy An individual container of yogurt (6-8 oz). 1 piece of cheese the size of your thumb (1 oz). 1 cup (8 oz) milk or milk alternative.  Fat A piece the size of one dice. 1 tsp soft margarine. 1 Tbsp mayonnaise. 1 tsp vegetable oil. 1 Tbsp regular salad dressing. 2 Tbsp low-fat salad dressing.  HOW MANY SERVINGS SHOULD I EAT FROM EACH FOOD GROUP EACH DAY?  The following are the suggested number of servings to try and have every day from each food group. You can also look at your eating throughout the week and aim for meeting these requirements on most days for overall healthy eating.  Grain 6-8 servings. Try to have half of your grains from whole grains, such as whole wheat bread, corn tortillas, oatmeal, brown rice, whole wheat pasta, and bulgur. Vegetable At least 2-3 servings.  Fruit 2 servings.  Meat and Other Protein Foods 5-6  servings. Aim to have lean proteins, such as chicken, [place], fish, beans, or tofu. Dairy 3 servings. Choose low-fat or nonfat if you are trying to control your weight.  Fat 2-3 servings.  IS A SERVING THE SAME THING AS A PORTION? No. A portion is the actual amount you eat, which may be more than one serving. Knowing the specific serving size of a food and the nutritional information that goes with it can help you make a healthy decision on what size portion to eat.  WHAT ARE SOME TIPS TO HELP ME LEARN HEALTHY SERVING SIZES?  Check food labels for serving sizes. Many foods that come as a single portion actually contain multiple servings.  Determine the serving size of foods you commonly eat and figure out how large a portion you usually eat.  Measure the number of servings that can be held by the bowls, glasses, cups, and plates you typically use. For example, pour your breakfast cereal into your regular bowl and then pour it into a measuring cup.  For 1-2 days, measure the serving sizes of all the foods you eat.  Practice estimating serving sizes and determining how big your portions should be.   This information is not intended to replace advice given to you by your health care provider. Make sure you discuss any questions  you have with your health care provider.   Document Released: 11/29/2002 Document Revised: 03/23/2014 Document Reviewed: 05/30/2013 Elsevier Interactive Patient Education 2016 [place] Maintenance, Male A healthy lifestyle and preventative care can promote health and wellness.  Maintain regular health, dental, and eye exams.  Eat a healthy diet. Foods like vegetables, fruits, whole grains, low-fat dairy products, and lean protein foods contain the nutrients you need and are low in calories. Decrease your intake of foods high in solid fats, added sugars, and salt. Get information about a proper diet from your health care provider, if  necessary.  Regular physical exercise is one of the most important things you can do for your health. Most adults should get at least 150 minutes of moderate-intensity exercise (any activity that increases your heart rate and causes you to sweat) each week. In addition, most adults need muscle-strengthening exercises on 2 or more days a week.   Maintain a healthy weight. The body mass index (BMI) is a screening tool to identify possible weight problems. It provides an estimate of body fat based on height and weight. Your health care provider can find your BMI and can help you achieve or maintain a healthy weight. For males 20 years and older:  A BMI below 18.5 is considered underweight.  A BMI of 18.5 to 24.9 is normal.  A BMI of 25 to 29.9 is considered overweight.  A BMI of 30 and above is considered obese.  Maintain normal blood lipids and cholesterol by exercising and minimizing your intake of saturated fat. Eat a balanced diet with plenty of fruits and vegetables. Blood tests for lipids and cholesterol should begin at age 71 and be repeated every 5 years. If your lipid or cholesterol levels are high, you are over age 18, or you are at high risk for heart disease, you may need your cholesterol levels checked more frequently.Ongoing high lipid and cholesterol levels should be treated with medicines if diet and exercise are not working.  If you smoke, find out from your health care provider how to quit. If you do not use tobacco, do not start.  Lung cancer screening is recommended for adults aged 58-80 years who are at high risk for developing lung cancer because of a history of smoking. A yearly low-dose CT scan of the lungs is recommended for people who have at least a 30-pack-year history of smoking and are current smokers or have quit within the past 15 years. A pack year of smoking is smoking an average of 1 pack of cigarettes a day for 1 year (for example, a 30-pack-year history of  smoking could mean smoking 1 pack a day for 30 years or 2 packs a day for 15 years). Yearly screening should continue until the smoker has stopped smoking for at least 15 years. Yearly screening should be stopped for people who develop a health problem that would prevent them from having lung cancer treatment.  If you choose to drink alcohol, do not have more than 2 drinks per day. One drink is considered to be 12 oz (360 mL) of beer, 5 oz (150 mL) of wine, or 1.5 oz (45 mL) of liquor.  Avoid the use of street drugs. Do not share needles with anyone. Ask for help if you need support or instructions about stopping the use of drugs.  High blood pressure causes heart disease and increases the risk of stroke. High blood pressure is more likely to develop in:  People who  have blood pressure in the end of the normal range (100-139/85-89 mm Hg).  People who are overweight or obese.  People who are African American.  If you are 66-68 years of age, have your blood pressure checked every 3-5 years. If you are 20 years of age or older, have your blood pressure checked every year. You should have your blood pressure measured twice--once when you are at a hospital or clinic, and once when you are not at a hospital or clinic. Record the average of the two measurements. To check your blood pressure when you are not at a hospital or clinic, you can use:  An automated blood pressure machine at a pharmacy.  A home blood pressure monitor.  If you are 67-66 years old, ask your health care provider if you should take aspirin to prevent heart disease.  Diabetes screening involves taking a blood sample to check your fasting blood sugar level. This should be done once every 3 years after age 49 if you are at a normal weight and without risk factors for diabetes. Testing should be considered at a younger age or be carried out more frequently if you are overweight and have at least 1 risk factor for  diabetes.  Colorectal cancer can be detected and often prevented. Most routine colorectal cancer screening begins at the age of 35 and continues through age 76. However, your health care provider may recommend screening at an earlier age if you have risk factors for colon cancer. On a yearly basis, your health care provider may provide home test kits to check for hidden blood in the stool. A small camera at the end of a tube may be used to directly examine the colon (sigmoidoscopy or colonoscopy) to detect the earliest forms of colorectal cancer. Talk to your health care provider about this at age 97 when routine screening begins. A direct exam of the colon should be repeated every 5-10 years through age 29, unless early forms of precancerous polyps or small growths are found.  People who are at an increased risk for hepatitis B should be screened for this virus. You are considered at high risk for hepatitis B if:  You were born in a country where hepatitis B occurs often. Talk with your health care provider about which countries are considered high risk.  Your parents were born in a high-risk country and you have not received a shot to protect against hepatitis B (hepatitis B vaccine).  You have HIV or AIDS.  You use needles to inject street drugs.  You live with, or have sex with, someone who has hepatitis B.  You are a man who has sex with other men (MSM).  You get hemodialysis treatment.  You take certain medicines for conditions like cancer, organ transplantation, and autoimmune conditions.  Hepatitis C blood testing is recommended for all people born from [birth year] through 1963 and any individual with known risk factors for hepatitis C.  Healthy men should no longer receive prostate-specific antigen (PSA) blood tests as part of routine cancer screening. Talk to your health care provider about prostate cancer screening.  Testicular cancer screening is not recommended for adolescents or  adult males who have no symptoms. Screening includes self-exam, a health care provider exam, and other screening tests. Consult with your health care provider about any symptoms you have or any concerns you have about testicular cancer.  Practice safe sex. Use condoms and avoid high-risk sexual practices to reduce the spread of sexually transmitted  infections (STIs).  You should be screened for STIs, including gonorrhea and chlamydia if:  You are sexually active and are younger than 24 years.  You are older than 24 years, and your health care provider tells you that you are at risk for this type of infection.  Your sexual activity has changed since you were last screened, and you are at an increased risk for chlamydia or gonorrhea. Ask your health care provider if you are at risk.  If you are at risk of being infected with HIV, it is recommended that you take a prescription medicine daily to prevent HIV infection. This is called pre-exposure prophylaxis (PrEP). You are considered at risk if:  You are a man who has sex with other men (MSM).  You are a heterosexual man who is sexually active with multiple partners.  You take drugs by injection.  You are sexually active with a partner who has HIV.  Talk with your health care provider about whether you are at high risk of being infected with HIV. If you choose to begin PrEP, you should first be tested for HIV. You should then be tested every 3 months for as long as you are taking PrEP.  Use sunscreen. Apply sunscreen liberally and repeatedly throughout the day. You should seek shade when your shadow is shorter than you. Protect yourself by wearing long sleeves, pants, a wide-brimmed hat, and sunglasses year round whenever you are outdoors.  Tell your health care provider of new moles or changes in moles, especially if there is a change in shape or color. Also, tell your health care provider if a mole is larger than the size of a pencil  eraser.  A one-time screening for abdominal aortic aneurysm (AAA) and surgical repair of large AAAs by ultrasound is recommended for men aged 25-75 years who are current or former smokers.  Stay current with your vaccines (immunizations).   This information is not intended to replace advice given to you by your health care provider. Make sure you discuss any questions you have with your health care provider.   Document Released: 08/29/2007 Document Revised: 03/23/2014 Document Reviewed: 07/28/2010 Elsevier Interactive Patient Education [employer].

## 2015-06-27 NOTE — Assessment & Plan Note (Addendum)
Cologuard ordered today (last colonoscopy clean in June 2007), immunizations up to date minus shingles which he declines today. Counseled on sun safety with his history of colon cancer. Referral to dermatology for several lesion removals that he desires. Given 10 year screening recommendations.

## 2015-06-27 NOTE — Assessment & Plan Note (Signed)
Checking TSH and free T4 and adjust as needed. Taking synthroid 50 mcg daily.  

## 2015-06-27 NOTE — Progress Notes (Signed)
Pre visit review using our clinic review tool, if applicable. No additional management support is needed unless otherwise documented below in the visit note. 

## 2015-06-27 NOTE — Assessment & Plan Note (Signed)
BP at goal on coreg, reviewed recent CMP and no indication for change.

## 2015-06-27 NOTE — Progress Notes (Signed)
   Subjective:    Patient ID: [name] [name], male    DOB: Feb 18, 1941, 75 y.o.   MRN: [id number]  HPI Here for medicare wellness, no new complaints. Please see A/P for status and treatment of chronic medical problems.   Diet: heart healthy Physical activity: active Depression/mood screen: negative Hearing: intact to whispered voice Visual acuity: grossly normal with lens, performs annual eye exam  ADLs: capable Fall risk: none Home safety: good Cognitive evaluation: intact to orientation, naming, recall and repetition EOL planning: adv directives discussed  I have personally reviewed and have noted 1. The patient's medical and social history - reviewed today no changes 2. Their use of alcohol, tobacco or illicit drugs 3. Their current medications and supplements 4. The patient's functional ability including ADL's, fall risks, home safety risks and hearing or visual impairment. 5. Diet and physical activities 6. Evidence for depression or mood disorders 7. Care team reviewed and updated (available in snapshot)  Review of Systems  Constitutional: Negative for fever, activity change, appetite change, fatigue and unexpected weight change.  HENT: Negative.   Eyes: Negative.   Respiratory: Negative for cough, chest tightness, shortness of breath and wheezing.   Cardiovascular: Negative for chest pain, palpitations and leg swelling.  Gastrointestinal: Negative for abdominal pain, diarrhea, constipation and abdominal distention.  Musculoskeletal: Negative.   Skin: Negative.   Neurological: Negative.   Psychiatric/Behavioral: Negative.       Objective:   Physical Exam  Constitutional: He is oriented to person, place, and time. He appears well-developed and well-nourished.  HENT:  Head: Normocephalic and atraumatic.  Eyes: EOM are normal.  Neck: Normal range of motion.  Cardiovascular: Normal rate and regular rhythm.   No carotid bruit bilaterally.   Pulmonary/Chest: Effort  normal and breath sounds normal. No respiratory distress. He has no wheezes.  Abdominal: Soft. Bowel sounds are normal.  Neurological: He is alert and oriented to person, place, and time.  Skin: Rash noted.  On his face, spot on his right arm which appears to be actinic keratosis (17mm circular) unchanged from last year   Filed Vitals:   06/27/15 0846  BP: 124/88  Pulse: 76  Temp: 97.9 F (36.6 C)  TempSrc: Oral  Resp: 16  Height: 5\' 9"  (1.753 m)  Weight: 228 lb 1.9 oz (103.475 kg)  SpO2: 98%      Assessment & Plan:

## 2015-07-03 ENCOUNTER — Encounter: Payer: Self-pay | Admitting: Internal Medicine

## 2015-07-04 ENCOUNTER — Ambulatory Visit (INDEPENDENT_AMBULATORY_CARE_PROVIDER_SITE_OTHER): Payer: Medicare Other | Admitting: *Deleted

## 2015-07-04 DIAGNOSIS — I4891 Unspecified atrial fibrillation: Secondary | ICD-10-CM | POA: Diagnosis not present

## 2015-07-04 DIAGNOSIS — Z7901 Long term (current) use of anticoagulants: Secondary | ICD-10-CM

## 2015-07-04 LAB — POCT INR: INR: 2.6

## 2015-07-25 ENCOUNTER — Other Ambulatory Visit: Payer: Self-pay | Admitting: Internal Medicine

## 2015-07-29 DIAGNOSIS — L570 Actinic keratosis: Secondary | ICD-10-CM | POA: Diagnosis not present

## 2015-07-29 DIAGNOSIS — L814 Other melanin hyperpigmentation: Secondary | ICD-10-CM | POA: Diagnosis not present

## 2015-07-29 DIAGNOSIS — D229 Melanocytic nevi, unspecified: Secondary | ICD-10-CM | POA: Diagnosis not present

## 2015-07-29 DIAGNOSIS — L409 Psoriasis, unspecified: Secondary | ICD-10-CM | POA: Diagnosis not present

## 2015-07-29 DIAGNOSIS — L821 Other seborrheic keratosis: Secondary | ICD-10-CM | POA: Diagnosis not present

## 2015-07-29 DIAGNOSIS — D1800 Hemangioma unspecified site: Secondary | ICD-10-CM | POA: Diagnosis not present

## 2015-08-05 ENCOUNTER — Ambulatory Visit (INDEPENDENT_AMBULATORY_CARE_PROVIDER_SITE_OTHER): Payer: Medicare Other | Admitting: *Deleted

## 2015-08-05 DIAGNOSIS — I4891 Unspecified atrial fibrillation: Secondary | ICD-10-CM

## 2015-08-05 DIAGNOSIS — Z7901 Long term (current) use of anticoagulants: Secondary | ICD-10-CM | POA: Diagnosis not present

## 2015-08-05 LAB — POCT INR: INR: 2.6

## 2015-09-08 ENCOUNTER — Other Ambulatory Visit: Payer: Self-pay | Admitting: Cardiology

## 2015-09-19 ENCOUNTER — Ambulatory Visit (INDEPENDENT_AMBULATORY_CARE_PROVIDER_SITE_OTHER): Payer: Medicare Other | Admitting: *Deleted

## 2015-09-19 DIAGNOSIS — Z7901 Long term (current) use of anticoagulants: Secondary | ICD-10-CM | POA: Diagnosis not present

## 2015-09-19 DIAGNOSIS — I4891 Unspecified atrial fibrillation: Secondary | ICD-10-CM

## 2015-09-19 LAB — POCT INR: INR: 1.9

## 2015-09-24 ENCOUNTER — Encounter: Payer: Self-pay | Admitting: Internal Medicine

## 2015-09-25 ENCOUNTER — Ambulatory Visit (INDEPENDENT_AMBULATORY_CARE_PROVIDER_SITE_OTHER): Payer: Medicare Other | Admitting: Cardiology

## 2015-09-25 ENCOUNTER — Encounter: Payer: Self-pay | Admitting: Cardiology

## 2015-09-25 VITALS — BP 132/72 | HR 54 | Ht 69.0 in | Wt 225.0 lb

## 2015-09-25 DIAGNOSIS — I2510 Atherosclerotic heart disease of native coronary artery without angina pectoris: Secondary | ICD-10-CM

## 2015-09-25 DIAGNOSIS — I480 Paroxysmal atrial fibrillation: Secondary | ICD-10-CM

## 2015-09-25 LAB — CBC WITH DIFFERENTIAL/PLATELET
Basophils Absolute: 61 cells/uL (ref 0–200)
Basophils Relative: 1 %
EOS ABS: 305 {cells}/uL (ref 15–500)
Eosinophils Relative: 5 %
HEMATOCRIT: 40.8 % (ref 38.5–50.0)
HEMOGLOBIN: 14 g/dL (ref 13.2–17.1)
LYMPHS ABS: 1098 {cells}/uL (ref 850–3900)
Lymphocytes Relative: 18 %
MCH: 32.9 pg (ref 27.0–33.0)
MCHC: 34.3 g/dL (ref 32.0–36.0)
MCV: 96 fL (ref 80.0–100.0)
MONO ABS: 732 {cells}/uL (ref 200–950)
MPV: 10.6 fL (ref 7.5–12.5)
Monocytes Relative: 12 %
NEUTROS PCT: 64 %
Neutro Abs: 3904 cells/uL (ref 1500–7800)
Platelets: 207 10*3/uL (ref 140–400)
RBC: 4.25 MIL/uL (ref 4.20–5.80)
RDW: 14.7 % (ref 11.0–15.0)
WBC: 6.1 10*3/uL (ref 3.8–10.8)

## 2015-09-25 NOTE — Patient Instructions (Signed)
Medication Instructions:  Your physician recommends that you continue on your current medications as directed. Please refer to the Current Medication list given to you today.   Labwork: BMET/CBCd/Lipid profile today  Testing/Procedures: None   Follow-Up: Your physician wants you to follow-up in: 1 year with Dr Aundra Dubin. (July 2018). You will receive a reminder letter in the mail two months in advance. If you don't receive a letter, please call our office to schedule the follow-up appointment.        If you need a refill on your cardiac medications before your next appointment, please call your pharmacy.

## 2015-09-26 LAB — LIPID PANEL
CHOL/HDL RATIO: 3.2 ratio (ref <=5.0)
Cholesterol: 140 mg/dL (ref 125–200)
HDL: 44 mg/dL (ref >=40)
LDL CALC: 76 mg/dL (ref <130)
Triglycerides: 100 mg/dL (ref <150)
VLDL: 20 mg/dL (ref <30)

## 2015-09-26 LAB — BASIC METABOLIC PANEL
BUN: 16 mg/dL (ref 7–25)
CHLORIDE: 104 mmol/L (ref 98–110)
CO2: 25 mmol/L (ref 20–31)
Calcium: 9.1 mg/dL (ref 8.6–10.3)
Creat: 0.98 mg/dL (ref 0.70–1.18)
GLUCOSE: 86 mg/dL (ref 65–99)
POTASSIUM: 3.8 mmol/L (ref 3.5–5.3)
SODIUM: 141 mmol/L (ref 135–146)

## 2015-09-27 NOTE — Progress Notes (Signed)
Patient ID: [name] [name], male   DOB: 03-29-1940, 75 y.o.   MRN: [id number] PCP: Dr. Sharlet Salina  75 yo with history of CAD s/p PCI in 2005 and paroxysmal atrial fibrillation presents for cardiology followup.  He has been doing well.  He does yardwork and can walk up a hill with no dyspnea, but he does not have the stamina that he used to have.  No chest pain.  No orthopnea or PND.  No syncope.   No stroke-like symptoms.  No tachypalpitations.  He is on coumadin for PAF.  BP is well-controlled.  No melena/BRBPR.   ECG: NSR, normal  Labs (10/12): K 4.2, creatinine 1.0, LDL 70, HDL 54, AST/ALT ok. Labs (6/13): K 4.1, creatinine 0.86, HCT 40.8, LDL 69, HDL 49 Labs (7/14): K 3.8, creatinine 0.84, HCT 40.5, LDL 81, HDL 51, TSH normal Labs (1/15): HCT 42.8, LDL 74, HDL 48 Labs (7/15): K 4.3, creatinine 0.88 Labs (10/15): LDL 101, HDL 45, K 4.1, creatinine 1.0 Labs (11/16): HCT 41.9, K 4.1, creatinine 0.82, LDL 82, HDL 45  PMH: 1. Gilberts syndrome 2. CAD: NSTEMI in 2005 with DES to mid RCA and ostial PLV ([place]).  Myoview in 1/12 with no ischemia or infarction. Echo (12/12): EF 55-60%, moderate LVH, grade II diastolic dysfunction.  3. Hypothyroidism 4. HTN  5. Nephrolithiasis 6. Hyperlipidemia 7. Atrial fibrillation: First noted in 12/12.   SH: Widower.  3 children.  Lives in [place].  Retired Chief Financial Officer.  Quit smoking in 1994.   FH: No premature CAD   Current Outpatient Prescriptions  Medication Sig Dispense Refill  . carvedilol (COREG) 12.5 MG tablet Take 1 tablet (12.5 mg total) by mouth 2 (two) times daily with a meal. 180 tablet 3  . Glucosamine-Chondroit-Vit C-Mn (GLUCOSAMINE 1500 COMPLEX PO) Take 1 tablet by mouth 2 (two) times daily.     Marland Kitchen levothyroxine (SYNTHROID, LEVOTHROID) 50 MCG tablet TAKE ONE TABLET BY MOUTH ONCE DAILY.NEED  LABS  AND  NEW  DOCTOR  VISIT. 90 tablet 0  . NITROSTAT 0.4 MG SL tablet PLACE 1 TABLET UNDER TONGUE EVERY 5 MINUTES ASNEEDED 100 tablet 6  .  simvastatin (ZOCOR) 40 MG tablet Take 1 tablet (40 mg total) by mouth daily. 90 tablet 3  . warfarin (COUMADIN) 5 MG tablet TAKE AS DIRECTED BY  ANTICOAGULATION  CLINIC. 120 tablet 0   No current facility-administered medications for this visit.    BP 132/72 mmHg  Pulse 54  Ht 5\' 9"  (1.753 m)  Wt 225 lb (102.059 kg)  BMI 33.21 kg/m2 General: NAD, overweight.  Neck: JVP 7 cm, no thyromegaly or thyroid nodule.  Lungs: Clear to auscultation bilaterally with normal respiratory effort. CV: Nondisplaced PMI.  Heart regular S1/S2, no S3/S4, no murmur.  No edema.  No carotid bruit.  Normal pedal pulses.  Abdomen: Soft, nontender, no hepatosplenomegaly, no distention.  Neurologic: Alert and oriented x 3.  Psych: Normal affect. Extremities: No clubbing or cyanosis. Lower leg varicosities.   Assessment/Plan: 1. Atrial fibrillation: Paroxysmal.  NSR today.  No symptomatic recurrence.  We have talked about NOAC in the past, he wants to continue warfarin, has not had any problems with it.  Check CBC today.  2. CAD: Stable, no ischemic symptoms.  Not on aspirin as he is on warfarin.  Continue statin, Coreg.  3. Hyperlipidemia: Check lipids today, goal LDL < 70.  If not at goal, would change to atorvastatin.   Followup in 1 year.   Loralie Champagne 09/27/2015

## 2015-10-09 ENCOUNTER — Ambulatory Visit (INDEPENDENT_AMBULATORY_CARE_PROVIDER_SITE_OTHER): Payer: Medicare Other | Admitting: Pharmacist

## 2015-10-09 DIAGNOSIS — I4891 Unspecified atrial fibrillation: Secondary | ICD-10-CM | POA: Diagnosis not present

## 2015-10-09 DIAGNOSIS — Z7901 Long term (current) use of anticoagulants: Secondary | ICD-10-CM

## 2015-10-09 LAB — POCT INR: INR: 2.5

## 2015-10-21 ENCOUNTER — Other Ambulatory Visit: Payer: Self-pay | Admitting: Geriatric Medicine

## 2015-10-21 ENCOUNTER — Telehealth: Payer: Self-pay | Admitting: Internal Medicine

## 2015-10-21 MED ORDER — LEVOTHYROXINE SODIUM 50 MCG PO TABS: ORAL_TABLET | ORAL | 3 refills | Status: DC

## 2015-10-21 NOTE — Telephone Encounter (Signed)
Patient states he needs levothyroxine resent to [facility].  States Vladimir Faster does not have last script sent in May.   Also, patient states he was to received a cologuard kit and has not received that yet.   Please follow up on both and give patient a call back.

## 2015-10-21 NOTE — Telephone Encounter (Signed)
Sent to pharmacy and cologard faxed.

## 2015-11-06 ENCOUNTER — Ambulatory Visit (INDEPENDENT_AMBULATORY_CARE_PROVIDER_SITE_OTHER): Payer: Medicare Other | Admitting: *Deleted

## 2015-11-06 DIAGNOSIS — I4891 Unspecified atrial fibrillation: Secondary | ICD-10-CM | POA: Diagnosis not present

## 2015-11-06 DIAGNOSIS — Z7901 Long term (current) use of anticoagulants: Secondary | ICD-10-CM

## 2015-11-06 LAB — POCT INR: INR: 2.5

## 2015-11-19 ENCOUNTER — Encounter: Payer: Self-pay | Admitting: Internal Medicine

## 2015-11-19 DIAGNOSIS — Z1211 Encounter for screening for malignant neoplasm of colon: Secondary | ICD-10-CM | POA: Diagnosis not present

## 2015-11-19 DIAGNOSIS — Z1212 Encounter for screening for malignant neoplasm of rectum: Secondary | ICD-10-CM | POA: Diagnosis not present

## 2015-11-20 LAB — COLOGUARD: COLOGUARD: NEGATIVE

## 2015-12-10 DIAGNOSIS — L570 Actinic keratosis: Secondary | ICD-10-CM | POA: Diagnosis not present

## 2015-12-10 DIAGNOSIS — L409 Psoriasis, unspecified: Secondary | ICD-10-CM | POA: Diagnosis not present

## 2015-12-10 DIAGNOSIS — Z23 Encounter for immunization: Secondary | ICD-10-CM | POA: Diagnosis not present

## 2015-12-12 ENCOUNTER — Ambulatory Visit (INDEPENDENT_AMBULATORY_CARE_PROVIDER_SITE_OTHER): Payer: Medicare Other | Admitting: *Deleted

## 2015-12-12 DIAGNOSIS — Z7901 Long term (current) use of anticoagulants: Secondary | ICD-10-CM

## 2015-12-12 DIAGNOSIS — I4891 Unspecified atrial fibrillation: Secondary | ICD-10-CM | POA: Diagnosis not present

## 2015-12-12 LAB — POCT INR: INR: 3.1

## 2015-12-23 ENCOUNTER — Other Ambulatory Visit: Payer: Self-pay | Admitting: Cardiology

## 2015-12-31 ENCOUNTER — Ambulatory Visit (INDEPENDENT_AMBULATORY_CARE_PROVIDER_SITE_OTHER): Payer: Medicare Other | Admitting: Pharmacist

## 2015-12-31 DIAGNOSIS — Z7901 Long term (current) use of anticoagulants: Secondary | ICD-10-CM

## 2015-12-31 DIAGNOSIS — I4891 Unspecified atrial fibrillation: Secondary | ICD-10-CM

## 2015-12-31 LAB — POCT INR: INR: 1.8

## 2016-01-10 ENCOUNTER — Ambulatory Visit (INDEPENDENT_AMBULATORY_CARE_PROVIDER_SITE_OTHER): Payer: Medicare Other | Admitting: Internal Medicine

## 2016-01-10 ENCOUNTER — Ambulatory Visit (INDEPENDENT_AMBULATORY_CARE_PROVIDER_SITE_OTHER)
Admission: RE | Admit: 2016-01-10 | Discharge: 2016-01-10 | Disposition: A | Discharge status: Home / Self Care | Payer: Medicare Other | Source: Ambulatory Visit | Attending: Internal Medicine | Admitting: Internal Medicine

## 2016-01-10 ENCOUNTER — Encounter: Payer: Self-pay | Admitting: Internal Medicine

## 2016-01-10 VITALS — BP 132/76 | HR 56 | Temp 98.3°F | Resp 14 | Ht 68.0 in | Wt 227.0 lb

## 2016-01-10 DIAGNOSIS — M79671 Pain in right foot: Secondary | ICD-10-CM

## 2016-01-10 DIAGNOSIS — I2510 Atherosclerotic heart disease of native coronary artery without angina pectoris: Secondary | ICD-10-CM | POA: Diagnosis not present

## 2016-01-10 NOTE — Progress Notes (Signed)
   Subjective:    Patient ID: [name] [name], male    DOB: November 18, 1940, 75 y.o.   MRN: [id number]  HPI The patient is a 75 YO man coming in for right foot pain. Going on for about 6 months and is worsening. He is okay when walking barefoot but when wearing shoes he does have pain with walking. Has tried OTC inserts which were not effective. No pain in the morning with first walking. Pain with more walking not with initial start. Previous fracture in the left foot so he wants to be checked for that.   Review of Systems  Constitutional: Positive for activity change. Negative for appetite change, chills, fatigue and fever.  Respiratory: Negative.   Cardiovascular: Negative.   Gastrointestinal: Negative.   Musculoskeletal: Positive for arthralgias. Negative for back pain, gait problem and myalgias.  Skin: Negative.       Objective:   Physical Exam  Constitutional: He is oriented to person, place, and time. He appears well-developed and well-nourished.  HENT:  Head: Normocephalic and atraumatic.  Eyes: EOM are normal.  Neck: Normal range of motion.  Cardiovascular: Normal rate and regular rhythm.   Pulmonary/Chest: Effort normal. No respiratory distress. He has no wheezes. He has no rales.  Abdominal: Soft.  Musculoskeletal:  No pain in the right foot, some bony prominence along the lateral 5th metatarsal  Neurological: He is alert and oriented to person, place, and time. Coordination normal.  Skin: Skin is warm and dry.   Vitals:   01/10/16 1122  BP: 132/76  Pulse: (!) 56  Resp: 14  Temp: 98.3 F (36.8 C)  TempSrc: Oral  SpO2: 98%  Weight: 227 lb (103 kg)  Height: 5\' 8"  (1.727 m)      Assessment & Plan:

## 2016-01-10 NOTE — Assessment & Plan Note (Signed)
Checking x-ray for stress fracture and referral to podiatry for custom orthotic.

## 2016-01-10 NOTE — Progress Notes (Signed)
Pre visit review using our clinic review tool, if applicable. No additional management support is needed unless otherwise documented below in the visit note. 

## 2016-01-10 NOTE — Patient Instructions (Signed)
We are checking the x-ray of the foot today and will send the results on mychart.   If that is normal we will send you to the foot specialist to get a custom insert made to help with pain.

## 2016-01-20 ENCOUNTER — Other Ambulatory Visit: Payer: Self-pay | Admitting: Cardiology

## 2016-01-20 DIAGNOSIS — I480 Paroxysmal atrial fibrillation: Secondary | ICD-10-CM

## 2016-01-23 ENCOUNTER — Ambulatory Visit (INDEPENDENT_AMBULATORY_CARE_PROVIDER_SITE_OTHER): Payer: Medicare Other | Admitting: *Deleted

## 2016-01-23 DIAGNOSIS — I4891 Unspecified atrial fibrillation: Secondary | ICD-10-CM

## 2016-01-23 DIAGNOSIS — Z7901 Long term (current) use of anticoagulants: Secondary | ICD-10-CM | POA: Diagnosis not present

## 2016-01-23 LAB — POCT INR: INR: 2.5

## 2016-01-24 ENCOUNTER — Encounter: Payer: Self-pay | Admitting: Sports Medicine

## 2016-01-24 ENCOUNTER — Ambulatory Visit (INDEPENDENT_AMBULATORY_CARE_PROVIDER_SITE_OTHER): Payer: Medicare Other

## 2016-01-24 ENCOUNTER — Ambulatory Visit (INDEPENDENT_AMBULATORY_CARE_PROVIDER_SITE_OTHER): Payer: Medicare Other | Admitting: Sports Medicine

## 2016-01-24 DIAGNOSIS — M79671 Pain in right foot: Secondary | ICD-10-CM | POA: Diagnosis not present

## 2016-01-24 DIAGNOSIS — M779 Enthesopathy, unspecified: Secondary | ICD-10-CM

## 2016-01-24 DIAGNOSIS — M7741 Metatarsalgia, right foot: Secondary | ICD-10-CM

## 2016-01-24 DIAGNOSIS — I2510 Atherosclerotic heart disease of native coronary artery without angina pectoris: Secondary | ICD-10-CM

## 2016-01-24 DIAGNOSIS — M216X1 Other acquired deformities of right foot: Secondary | ICD-10-CM

## 2016-01-24 NOTE — Progress Notes (Signed)
Subjective: [name] [name] is a 75 y.o. male patient who presents to office for evaluation of right foot pain. Patient complains of a dull ache or soreness to the lateral aspect of the right foot that started since March after walking with his neighbor 2 miles per day. Patient reports that he has tried rest and states that it feels better when he walks barefoot with no shoes on. Admits to a history of fracture and right extremity that was repaired in the 1980s and a history of right foot smaller than left. Patient denies any other pedal complaints.  Patient Active Problem List   Diagnosis Date Noted  . Right foot pain 01/10/2016  . Routine general medical examination at a health care facility 06/27/2015  . Hypothyroidism 12/21/2013  . Lip lesion 08/14/2013  . Poor dentition 08/14/2013  . Atrial fibrillation ([place]) 02/17/2011  . CAD (coronary artery disease)   . HLD (hyperlipidemia)   . HTN (hypertension)     Current Outpatient Prescriptions on File Prior to Visit  Medication Sig Dispense Refill  . carvedilol (COREG) 12.5 MG tablet TAKE ONE TABLET BY MOUTH TWICE DAILY WITH A MEAL 180 tablet 3  . Glucosamine-Chondroit-Vit C-Mn (GLUCOSAMINE 1500 COMPLEX PO) Take 1 tablet by mouth 2 (two) times daily.     Marland Kitchen levothyroxine (SYNTHROID, LEVOTHROID) 50 MCG tablet TAKE ONE TABLET BY MOUTH ONCE DAILY 90 tablet 3  . NITROSTAT 0.4 MG SL tablet PLACE 1 TABLET UNDER TONGUE EVERY 5 MINUTES ASNEEDED 100 tablet 6  . simvastatin (ZOCOR) 40 MG tablet Take 1 tablet (40 mg total) by mouth daily. 90 tablet 3  . warfarin (COUMADIN) 5 MG tablet TAKE AS DIRECTED BY ANTICOAGULATION CLINIC 120 tablet 0   No current facility-administered medications on file prior to visit.     No Known Allergies  Objective:  General: Alert and oriented x3 in no acute distress  Dermatology: No open lesions bilateral lower extremities, no webspace macerations, no ecchymosis bilateral, all nails x 10 are well  manicured.  Vascular: Dorsalis Pedis and Posterior Tibial pedal pulses palpable, Capillary Fill Time 5 seconds, scant pedal hair growth bilateral, no edema bilateral lower extremities, no varicosities present bilateral, Temperature gradient within normal limits.  Neurology: Johney Maine sensation intact via light touch bilateral. (- )Tinels sign bilateral.   Musculoskeletal: No reproducible tenderness with palpation at lateral aspect of the right foot however subjectively patient states that when he does excessive walking in shoes. Hurts at the lateral aspect of the foot along the fifth ray,No pain with calf compression bilateral. There is decreased ankle range of motion, subtalar range of motion and midtarsal joint range of motion with a fixed cavovarus foot type, right greater than left. Strength within normal limits in all groups bilateral.   Xrays  Right Foot   Impression: Normal osseous mineralization. There is significant supinated position of foot with cavovarus foot type noted inferior heel spur and hardware at level of ankle, no acute fracture or dislocation, soft tissue margins within normal limits.  Assessment and Plan: Problem List Items Addressed This Visit      Other   Right foot pain - Primary   Relevant Orders   DG Foot 2 Views Right    Other Visit Diagnoses    Cavovarus deformity of foot, acquired, right       Metatarsalgia, right foot       Capsulitis           -Complete examination performed -Xrays reviewed -Discussed treatement options for likely metatarsalgia  capsulitis secondary to cavovarus foot type that is induced by shoes -Recommended to patient risks protection, ice, elevation, and Epsom salt soaks as needed -Advised patient to consider stability shoes -Added to patient shoe liner kinetic to offload the lateral column felt wedge advised patient if this works well for him to consider custom molded inserts and to call office if he desires to have these made -Advised  patient if no improvement at next visit we'll consider steroid injection -Patient to return to office as needed or sooner if condition worsens.  Landis Martins, DPM

## 2016-01-24 NOTE — Patient Instructions (Addendum)
Recommend to try [facility] or shoes for stability

## 2016-02-20 ENCOUNTER — Ambulatory Visit (INDEPENDENT_AMBULATORY_CARE_PROVIDER_SITE_OTHER): Payer: Medicare Other | Admitting: Pharmacist

## 2016-02-20 DIAGNOSIS — Z7901 Long term (current) use of anticoagulants: Secondary | ICD-10-CM

## 2016-02-20 DIAGNOSIS — I4891 Unspecified atrial fibrillation: Secondary | ICD-10-CM | POA: Diagnosis not present

## 2016-02-20 DIAGNOSIS — I2510 Atherosclerotic heart disease of native coronary artery without angina pectoris: Secondary | ICD-10-CM

## 2016-02-20 LAB — POCT INR: INR: 3

## 2016-03-19 ENCOUNTER — Ambulatory Visit (INDEPENDENT_AMBULATORY_CARE_PROVIDER_SITE_OTHER): Payer: Medicare Other

## 2016-03-19 DIAGNOSIS — Z5181 Encounter for therapeutic drug level monitoring: Secondary | ICD-10-CM

## 2016-03-19 DIAGNOSIS — I480 Paroxysmal atrial fibrillation: Secondary | ICD-10-CM

## 2016-03-19 LAB — POCT INR: INR: 2.6

## 2016-03-20 ENCOUNTER — Telehealth: Payer: Self-pay | Admitting: Internal Medicine

## 2016-03-20 NOTE — Telephone Encounter (Signed)
Per pt they faxed result to [place] for sept 13th of 2017  If we need results we can call [phone]

## 2016-03-20 NOTE — Telephone Encounter (Signed)
Yes, since he has not had colon cancer screening this is still good because it can look for colon cancer or colon polyps.

## 2016-03-20 NOTE — Telephone Encounter (Signed)
Patient called to follow up on never receiving cologuard. Advised that we have no record of receiving results. He states that he never actually sent the kit in. They were apparently unable to confirm his insurance.  He is asking if it is still advantageous at age 76 to get the screening. Please advise patient.

## 2016-03-23 ENCOUNTER — Other Ambulatory Visit: Payer: Self-pay | Admitting: Cardiology

## 2016-03-25 NOTE — Telephone Encounter (Signed)
Spoke to patient, was able to pull up cologuard results which were negative.

## 2016-04-06 ENCOUNTER — Other Ambulatory Visit: Payer: Self-pay | Admitting: Cardiology

## 2016-04-30 ENCOUNTER — Ambulatory Visit (INDEPENDENT_AMBULATORY_CARE_PROVIDER_SITE_OTHER): Payer: Medicare Other

## 2016-04-30 DIAGNOSIS — I480 Paroxysmal atrial fibrillation: Secondary | ICD-10-CM

## 2016-04-30 DIAGNOSIS — Z5181 Encounter for therapeutic drug level monitoring: Secondary | ICD-10-CM | POA: Diagnosis not present

## 2016-04-30 LAB — POCT INR: INR: 2.6

## 2016-06-08 DIAGNOSIS — L821 Other seborrheic keratosis: Secondary | ICD-10-CM | POA: Diagnosis not present

## 2016-06-08 DIAGNOSIS — D225 Melanocytic nevi of trunk: Secondary | ICD-10-CM | POA: Diagnosis not present

## 2016-06-08 DIAGNOSIS — L570 Actinic keratosis: Secondary | ICD-10-CM | POA: Diagnosis not present

## 2016-06-08 DIAGNOSIS — L409 Psoriasis, unspecified: Secondary | ICD-10-CM | POA: Diagnosis not present

## 2016-06-08 DIAGNOSIS — L814 Other melanin hyperpigmentation: Secondary | ICD-10-CM | POA: Diagnosis not present

## 2016-06-08 DIAGNOSIS — D1800 Hemangioma unspecified site: Secondary | ICD-10-CM | POA: Diagnosis not present

## 2016-06-11 ENCOUNTER — Ambulatory Visit (INDEPENDENT_AMBULATORY_CARE_PROVIDER_SITE_OTHER): Payer: Medicare Other

## 2016-06-11 DIAGNOSIS — Z5181 Encounter for therapeutic drug level monitoring: Secondary | ICD-10-CM | POA: Diagnosis not present

## 2016-06-11 DIAGNOSIS — I480 Paroxysmal atrial fibrillation: Secondary | ICD-10-CM | POA: Diagnosis not present

## 2016-06-11 LAB — POCT INR: INR: 2.5

## 2016-06-25 ENCOUNTER — Ambulatory Visit (INDEPENDENT_AMBULATORY_CARE_PROVIDER_SITE_OTHER): Payer: Medicare Other | Admitting: Internal Medicine

## 2016-06-25 NOTE — Progress Notes (Signed)
Pre visit review using our clinic review tool, if applicable. No additional management support is needed unless otherwise documented below in the visit note. 

## 2016-07-01 ENCOUNTER — Encounter: Payer: Self-pay | Admitting: Internal Medicine

## 2016-07-01 ENCOUNTER — Ambulatory Visit (INDEPENDENT_AMBULATORY_CARE_PROVIDER_SITE_OTHER): Payer: Medicare Other | Admitting: Internal Medicine

## 2016-07-01 ENCOUNTER — Other Ambulatory Visit (INDEPENDENT_AMBULATORY_CARE_PROVIDER_SITE_OTHER): Payer: Medicare Other

## 2016-07-01 VITALS — BP 136/80 | HR 75 | Temp 98.1°F | Resp 12 | Ht 68.0 in | Wt 220.0 lb

## 2016-07-01 DIAGNOSIS — E785 Hyperlipidemia, unspecified: Secondary | ICD-10-CM

## 2016-07-01 DIAGNOSIS — E039 Hypothyroidism, unspecified: Secondary | ICD-10-CM | POA: Diagnosis not present

## 2016-07-01 DIAGNOSIS — Z0000 Encounter for general adult medical examination without abnormal findings: Secondary | ICD-10-CM

## 2016-07-01 DIAGNOSIS — I10 Essential (primary) hypertension: Secondary | ICD-10-CM

## 2016-07-01 LAB — TSH: TSH: 4.93 u[IU]/mL — ABNORMAL HIGH (ref 0.35–4.50)

## 2016-07-01 MED ORDER — LEVOTHYROXINE SODIUM 50 MCG PO TABS: ORAL_TABLET | ORAL | 3 refills | Status: DC

## 2016-07-01 NOTE — Progress Notes (Signed)
   Subjective:    Patient ID: [name] [name], male    DOB: 08-27-1940, 76 y.o.   MRN: [id number]  HPI Here for medicare wellness, no new complaints. Please see A/P for status and treatment of chronic medical problems.   HPI #2: Here for follow up of his thyroid (taking levothyroxine 50 mcg daily, no weight change, tremors, fatigue, heat or cold intolerance), no new concerns. His right foot is feeling better.   Diet: heart healthy Physical activity: sedentary Depression/mood screen: negative Hearing: intact to whispered voice Visual acuity: grossly normal with lens, performs annual eye exam  ADLs: capable Fall risk: low Home safety: good Cognitive evaluation: intact to orientation, naming, recall and repetition EOL planning: adv directives discussed  I have personally reviewed and have noted 1. The patient's medical and social history - reviewed today no changes 2. Their use of alcohol, tobacco or illicit drugs 3. Their current medications and supplements 4. The patient's functional ability including ADL's, fall risks, home safety risks and hearing or visual impairment. 5. Diet and physical activities 6. Evidence for depression or mood disorders 7. Care team reviewed and updated (available in snapshot)  Review of Systems  Constitutional: Negative.   HENT: Negative.   Eyes: Negative.   Respiratory: Negative for cough, chest tightness and shortness of breath.   Cardiovascular: Negative for chest pain, palpitations and leg swelling.  Gastrointestinal: Negative for abdominal distention, abdominal pain, constipation, diarrhea, nausea and vomiting.  Musculoskeletal: Negative.   Skin: Negative.   Neurological: Negative.   Psychiatric/Behavioral: Negative.       Objective:   Physical Exam  Constitutional: He is oriented to person, place, and time. He appears well-developed and well-nourished.  HENT:  Head: Normocephalic and atraumatic.  Eyes: EOM are normal.  Neck: Normal range  of motion.  Cardiovascular: Normal rate and regular rhythm.   Pulmonary/Chest: Effort normal and breath sounds normal. No respiratory distress. He has no wheezes. He has no rales.  Abdominal: Soft. Bowel sounds are normal. He exhibits no distension. There is no tenderness. There is no rebound.  Musculoskeletal: He exhibits no edema.  Neurological: He is alert and oriented to person, place, and time. Coordination normal.  Skin: Skin is warm and dry.  Psychiatric: He has a normal mood and affect.   Vitals:   07/01/16 0800  BP: 136/80  Pulse: 75  Resp: 12  Temp: 98.1 F (36.7 C)  TempSrc: Oral  SpO2: 98%  Weight: 220 lb (99.8 kg)  Height: 5\' 8"  (1.727 m)      Assessment & Plan:

## 2016-07-01 NOTE — Assessment & Plan Note (Signed)
Checking TSH, taking levothyroxine 50 mcg daily. Adjust as needed. No symptoms of over or under replacement.

## 2016-07-01 NOTE — Assessment & Plan Note (Signed)
BP at goal on his coreg, recent CMP at goal and will not repeat.

## 2016-07-01 NOTE — Assessment & Plan Note (Signed)
Last LDL at goal of <100, taking simvastatin 40 mg daily and monitored by cardiology.

## 2016-07-01 NOTE — Patient Instructions (Signed)
The shingrix is the shingles shot that we talked about to decrease the risk.   Keep up the good work with food and add back the exercise.    Health Maintenance, Male A healthy lifestyle and preventive care is important for your health and wellness. Ask your health care provider about what schedule of regular examinations is right for you. What should I know about weight and diet?  Eat a Healthy Diet  Eat plenty of vegetables, fruits, whole grains, low-fat dairy products, and lean protein.  Do not eat a lot of foods high in solid fats, added sugars, or salt. Maintain a Healthy Weight  Regular exercise can help you achieve or maintain a healthy weight. You should:  Do at least 150 minutes of exercise each week. The exercise should increase your heart rate and make you sweat (moderate-intensity exercise).  Do strength-training exercises at least twice a week. Watch Your Levels of Cholesterol and Blood Lipids  Have your blood tested for lipids and cholesterol every 5 years starting at 74 years of age. If you are at high risk for heart disease, you should start having your blood tested when you are 74 years old. You may need to have your cholesterol levels checked more often if:  Your lipid or cholesterol levels are high.  You are older than 74 years of age.  You are at high risk for heart disease. What should I know about cancer screening? Many types of cancers can be detected early and may often be prevented. Lung Cancer  You should be screened every year for lung cancer if:  You are a current smoker who has smoked for at least 30 years.  You are a former smoker who has quit within the past 15 years.  Talk to your health care provider about your screening options, when you should start screening, and how often you should be screened. Colorectal Cancer  Routine colorectal cancer screening usually begins at 74 years of age and should be repeated every 5-10 years until you are 74  years old. You may need to be screened more often if early forms of precancerous polyps or small growths are found. Your health care provider may recommend screening at an earlier age if you have risk factors for colon cancer.  Your health care provider may recommend using home test kits to check for hidden blood in the stool.  A small camera at the end of a tube can be used to examine your colon (sigmoidoscopy or colonoscopy). This checks for the earliest forms of colorectal cancer. Prostate and Testicular Cancer  Depending on your age and overall health, your health care provider may do certain tests to screen for prostate and testicular cancer.  Talk to your health care provider about any symptoms or concerns you have about testicular or prostate cancer. Skin Cancer  Check your skin from head to toe regularly.  Tell your health care provider about any new moles or changes in moles, especially if:  There is a change in a mole's size, shape, or color.  You have a mole that is larger than a pencil eraser.  Always use sunscreen. Apply sunscreen liberally and repeat throughout the day.  Protect yourself by wearing long sleeves, pants, a wide-brimmed hat, and sunglasses when outside. What should I know about heart disease, diabetes, and high blood pressure?  If you are 59-39 years of age, have your blood pressure checked every 3-5 years. If you are 8 years of age or older,  have your blood pressure checked every year. You should have your blood pressure measured twice-once when you are at a hospital or clinic, and once when you are not at a hospital or clinic. Record the average of the two measurements. To check your blood pressure when you are not at a hospital or clinic, you can use:  An automated blood pressure machine at a pharmacy.  A home blood pressure monitor.  Talk to your health care provider about your target blood pressure.  If you are between 32-76 years old, ask your  health care provider if you should take aspirin to prevent heart disease.  Have regular diabetes screenings by checking your fasting blood sugar level.  If you are at a normal weight and have a low risk for diabetes, have this test once every three years after the age of 27.  If you are overweight and have a high risk for diabetes, consider being tested at a younger age or more often.  A one-time screening for abdominal aortic aneurysm (AAA) by ultrasound is recommended for men aged 64-75 years who are current or former smokers. What should I know about preventing infection? Hepatitis B  If you have a higher risk for hepatitis B, you should be screened for this virus. Talk with your health care provider to find out if you are at risk for hepatitis B infection. Hepatitis C  Blood testing is recommended for:  Everyone born from [birth year] through 1963.  Anyone with known risk factors for hepatitis C. Sexually Transmitted Diseases (STDs)  You should be screened each year for STDs including gonorrhea and chlamydia if:  You are sexually active and are younger than 74 years of age.  You are older than 74 years of age and your health care provider tells you that you are at risk for this type of infection.  Your sexual activity has changed since you were last screened and you are at an increased risk for chlamydia or gonorrhea. Ask your health care provider if you are at risk.  Talk with your health care provider about whether you are at high risk of being infected with HIV. Your health care provider may recommend a prescription medicine to help prevent HIV infection. What else can I do?  Schedule regular health, dental, and eye exams.  Stay current with your vaccines (immunizations).  Do not use any tobacco products, such as cigarettes, chewing tobacco, and e-cigarettes. If you need help quitting, ask your health care provider.  Limit alcohol intake to no more than 2 drinks per day. One drink  equals 12 ounces of beer, 5 ounces of wine, or 1 ounces of hard liquor.  Do not use street drugs.  Do not share needles.  Ask your health care provider for help if you need support or information about quitting drugs.  Tell your health care provider if you often feel depressed.  Tell your health care provider if you have ever been abused or do not feel safe at home. This information is not intended to replace advice given to you by your health care provider. Make sure you discuss any questions you have with your health care provider. Document Released: 08/29/2007 Document Revised: 10/30/2015 Document Reviewed: 12/04/2014 Elsevier Interactive Patient Education  2017 [employer].

## 2016-07-01 NOTE — Progress Notes (Signed)
Pre visit review using our clinic review tool, if applicable. No additional management support is needed unless otherwise documented below in the visit note. 

## 2016-07-01 NOTE — Assessment & Plan Note (Signed)
Flu, tetanus, pneumonia series completed. Cologuard for colorectal screening up to date. Counseled about shingrix and he will look into this. Counseled about sun safety and mole surveillance. Given 10 year screening recommendations.

## 2016-07-03 ENCOUNTER — Telehealth: Payer: Self-pay | Admitting: Internal Medicine

## 2016-07-03 NOTE — Telephone Encounter (Signed)
patient has viewed online, wants a call to discuss concerns, I understand his is slightly elevated but nothing to worry about, what would be ok to say to him. Please advise

## 2016-07-03 NOTE — Telephone Encounter (Signed)
Pt would like a call about his labs results from 4/18,  He is concerned about his thyroid level.

## 2016-07-03 NOTE — Telephone Encounter (Signed)
Patient contacted and aware                                                                                                          

## 2016-07-03 NOTE — Telephone Encounter (Signed)
Goal for TSH for him is <10 so his is perfectly normal.

## 2016-07-23 ENCOUNTER — Ambulatory Visit (INDEPENDENT_AMBULATORY_CARE_PROVIDER_SITE_OTHER): Payer: Medicare Other | Admitting: *Deleted

## 2016-07-23 DIAGNOSIS — Z5181 Encounter for therapeutic drug level monitoring: Secondary | ICD-10-CM

## 2016-07-23 DIAGNOSIS — I480 Paroxysmal atrial fibrillation: Secondary | ICD-10-CM

## 2016-07-23 LAB — POCT INR: INR: 2.8

## 2016-09-03 ENCOUNTER — Ambulatory Visit (INDEPENDENT_AMBULATORY_CARE_PROVIDER_SITE_OTHER): Payer: Medicare Other

## 2016-09-03 DIAGNOSIS — Z5181 Encounter for therapeutic drug level monitoring: Secondary | ICD-10-CM

## 2016-09-03 DIAGNOSIS — I480 Paroxysmal atrial fibrillation: Secondary | ICD-10-CM

## 2016-09-03 LAB — POCT INR: INR: 2.1

## 2016-09-22 ENCOUNTER — Other Ambulatory Visit: Payer: Self-pay | Admitting: Cardiology

## 2016-09-28 ENCOUNTER — Encounter: Payer: Self-pay | Admitting: *Deleted

## 2016-09-29 ENCOUNTER — Ambulatory Visit (INDEPENDENT_AMBULATORY_CARE_PROVIDER_SITE_OTHER): Payer: Medicare Other | Admitting: Cardiovascular Disease

## 2016-09-29 ENCOUNTER — Encounter: Payer: Self-pay | Admitting: Cardiovascular Disease

## 2016-09-29 VITALS — BP 141/82 | HR 52 | Ht 68.0 in | Wt 212.6 lb

## 2016-09-29 DIAGNOSIS — I2510 Atherosclerotic heart disease of native coronary artery without angina pectoris: Secondary | ICD-10-CM | POA: Diagnosis not present

## 2016-09-29 DIAGNOSIS — I480 Paroxysmal atrial fibrillation: Secondary | ICD-10-CM | POA: Diagnosis not present

## 2016-09-29 DIAGNOSIS — I10 Essential (primary) hypertension: Secondary | ICD-10-CM

## 2016-09-29 NOTE — Patient Instructions (Signed)

## 2016-09-29 NOTE — Progress Notes (Signed)
Cardiology Office Note   Date:  09/29/2016   ID:  [name] [name], DOB 04-28-1940, MRN [id number]  PCP:  Hoyt Koch, MD  Cardiologist:   Kathlyn Sacramento, MD   Chief Complaint  Patient presents with  . New Patient (Initial Visit)  . Edema    in right leg.       History of Present Illness: [name] [name] is a 76 y.o. male who presents To reestablish cardiovascular care. I saw him in the past in [place] and then he followed with Dr. Aundra Dubin. He has known history of coronary artery disease status post PCI in 2005 and paroxysmal atrial fibrillation. He is on long-term anticoagulation with warfarin with no reported side effects. He has been doing well with no chest pain, shortness of breath or palpitations. No dizziness. He denies any bleeding complications. He takes his medications regularly.    Past Medical History:  Diagnosis Date  . (HFpEF) heart failure with preserved ejection fraction ([place])   . Atrial fibrillation ([facility])    Paroxysmal, chronic anticoag  . BCC (basal cell carcinoma), lip 09/2013  . CAD (coronary artery disease) 2005   PCI ([place])  . HLD (hyperlipidemia)   . HTN (hypertension)   . Hypothyroidism   . Kidney stones   . Myocardial infarction, nontransmural, subendocardial 2005  . Psoriasis   . Seizures ([place])    x1 in the 1970s, no AEDs since mid 1970s    Past Surgical History:  Procedure Laterality Date  . BASAL CELL CARCINOMA EXCISION  09/2013  . CARDIAC CATHETERIZATION  2005   RCA: 99% mid. RPL: 70% ostial,   . CORONARY ANGIOPLASTY WITH STENT PLACEMENT  2005   RCA and RPL ([facility])  . HERNIA REPAIR  2008  . [place]  . LIPOMA EXCISION Right 09/21/2013   Procedure: LIP BIOPSY WITH FROZEN SECTION AND WEDGE EXCISION OF RIGHT UPPER LIP ;  Surgeon: Izora Gala, MD;  Location: [facility] [facility];  Service: ENT;  Laterality: Right;  . SKIN CANCER EXCISION  2009   right upper lip/nares  . TIBIA FRACTURE SURGERY  1982   right     Current  Outpatient Prescriptions  Medication Sig Dispense Refill  . carvedilol (COREG) 12.5 MG tablet TAKE ONE TABLET BY MOUTH TWICE DAILY WITH A MEAL 180 tablet 3  . Glucosamine-Chondroit-Vit C-Mn (GLUCOSAMINE 1500 COMPLEX PO) Take 1 tablet by mouth 2 (two) times daily.     Marland Kitchen levothyroxine (SYNTHROID, LEVOTHROID) 50 MCG tablet TAKE ONE TABLET BY MOUTH ONCE DAILY 90 tablet 3  . NITROSTAT 0.4 MG SL tablet PLACE 1 TABLET UNDER TONGUE EVERY 5 MINUTES ASNEEDED 100 tablet 6  . simvastatin (ZOCOR) 40 MG tablet TAKE 1 TABLET (40 MG TOTAL) BY MOUTH DAILY. 90 tablet 2  . triamcinolone cream (KENALOG) 0.1 %   0  . warfarin (COUMADIN) 5 MG tablet TAKE AS DIRECTED 120 tablet 1   No current facility-administered medications for this visit.     Allergies:   Patient has no known allergies.    Social History:  The patient  reports that he quit smoking about 23 years ago. He has a 9.00 pack-year smoking history. He has never used smokeless tobacco. He reports that he drinks about 1.2 oz of alcohol per week . He reports that he does not use drugs.   Family History:  The patient's family history includes Atrial fibrillation in his brother, mother, and sister; Heart attack in his father; Heart failure in his unknown relative;  Hypertension in his father.    ROS:  Please see the history of present illness.   Otherwise, review of systems are positive for none.   All other systems are reviewed and negative.    PHYSICAL EXAM: VS:  BP (!) 141/82   Pulse (!) 52   Ht 5\' 8"  (1.727 m)   Wt 212 lb 9.6 oz (96.4 kg)   BMI 32.33 kg/m  , BMI Body mass index is 32.33 kg/m. GEN: Well nourished, well developed, in no acute distress  HEENT: normal  Neck: no JVD, carotid bruits, or masses Cardiac: RRR; no murmurs, rubs, or gallops,no edema  Respiratory:  clear to auscultation bilaterally, normal work of breathing GI: soft, nontender, nondistended, + BS MS: no deformity or atrophy  Skin: warm and dry, no rash Neuro:   Strength and sensation are intact Psych: euthymic mood, full affect   EKG:  EKG is ordered today. The ekg ordered today demonstrates sinus bradycardia with sinus arrhythmia. For her with progression in the anterior leads.   Recent Labs: 07/01/2016: TSH 4.93    Lipid Panel    Component Value Date/Time   CHOL 140 09/25/2015 1648   TRIG 100 09/25/2015 1648   HDL 44 09/25/2015 1648   CHOLHDL 3.2 09/25/2015 1648   VLDL 20 09/25/2015 1648   LDLCALC 76 09/25/2015 1648      Wt Readings from Last 3 Encounters:  09/29/16 212 lb 9.6 oz (96.4 kg)  07/01/16 220 lb (99.8 kg)  01/10/16 227 lb (103 kg)      No flowsheet data found.    ASSESSMENT AND PLAN:  1.  Paroxysmal atrial fibrillation: He is maintaining in sinus rhythm. Continue carvedilol and warfarin. He is not interested in NOACs.   2. Coronary artery disease involving native coronary arteries without angina: Continue medical therapy.  3. Hyperlipidemia: Lipid profile last year showed an LDL of 75 which is close to target of less than 70. Continue simvastatin.    Disposition:   FU with me in 1 year  Signed,  Kathlyn Sacramento, MD  09/29/2016 8:29 AM    [place]

## 2016-10-05 ENCOUNTER — Other Ambulatory Visit: Payer: Self-pay

## 2016-10-15 ENCOUNTER — Ambulatory Visit (INDEPENDENT_AMBULATORY_CARE_PROVIDER_SITE_OTHER): Payer: Medicare Other | Admitting: *Deleted

## 2016-10-15 DIAGNOSIS — I480 Paroxysmal atrial fibrillation: Secondary | ICD-10-CM | POA: Diagnosis not present

## 2016-10-15 DIAGNOSIS — I2510 Atherosclerotic heart disease of native coronary artery without angina pectoris: Secondary | ICD-10-CM | POA: Diagnosis not present

## 2016-10-15 DIAGNOSIS — Z5181 Encounter for therapeutic drug level monitoring: Secondary | ICD-10-CM | POA: Diagnosis not present

## 2016-10-15 LAB — POCT INR: INR: 3.1

## 2016-11-03 ENCOUNTER — Other Ambulatory Visit: Payer: Self-pay | Admitting: Cardiology

## 2016-11-26 ENCOUNTER — Ambulatory Visit (INDEPENDENT_AMBULATORY_CARE_PROVIDER_SITE_OTHER): Payer: Medicare Other | Admitting: Pharmacist

## 2016-11-26 DIAGNOSIS — Z5181 Encounter for therapeutic drug level monitoring: Secondary | ICD-10-CM

## 2016-11-26 DIAGNOSIS — I480 Paroxysmal atrial fibrillation: Secondary | ICD-10-CM

## 2016-11-26 LAB — POCT INR: INR: 2.3

## 2016-12-24 DIAGNOSIS — Z23 Encounter for immunization: Secondary | ICD-10-CM | POA: Diagnosis not present

## 2017-01-07 ENCOUNTER — Ambulatory Visit (INDEPENDENT_AMBULATORY_CARE_PROVIDER_SITE_OTHER): Payer: Medicare Other | Admitting: *Deleted

## 2017-01-07 DIAGNOSIS — Z5181 Encounter for therapeutic drug level monitoring: Secondary | ICD-10-CM | POA: Diagnosis not present

## 2017-01-07 DIAGNOSIS — I480 Paroxysmal atrial fibrillation: Secondary | ICD-10-CM

## 2017-01-07 LAB — POCT INR: INR: 2.4

## 2017-01-11 ENCOUNTER — Other Ambulatory Visit: Payer: Self-pay | Admitting: Cardiology

## 2017-01-11 DIAGNOSIS — I480 Paroxysmal atrial fibrillation: Secondary | ICD-10-CM

## 2017-01-14 ENCOUNTER — Other Ambulatory Visit: Payer: Self-pay | Admitting: Cardiology

## 2017-01-14 ENCOUNTER — Other Ambulatory Visit: Payer: Self-pay

## 2017-01-14 ENCOUNTER — Other Ambulatory Visit: Payer: Self-pay | Admitting: Cardiovascular Disease

## 2017-01-14 ENCOUNTER — Other Ambulatory Visit: Payer: Self-pay | Admitting: *Deleted

## 2017-01-14 DIAGNOSIS — I480 Paroxysmal atrial fibrillation: Secondary | ICD-10-CM

## 2017-01-14 MED ORDER — CARVEDILOL 12.5 MG PO TABS: 12.5000 mg | ORAL_TABLET | Freq: Two times a day (BID) | ORAL | 3 refills | Status: DC

## 2017-01-14 MED ORDER — NITROGLYCERIN 0.4 MG SL SUBL: 0.4000 mg | SUBLINGUAL_TABLET | SUBLINGUAL | 3 refills | Status: DC | PRN

## 2017-01-14 NOTE — Telephone Encounter (Signed)
°*  STAT* If patient is at the pharmacy, call can be transferred to refill team.   1. Which medications need to be refilled? (please list name of each medication and dose if known) Carvedilol-need this called today if possible  2. Which pharmacy/location (including street and city if local pharmacy) is medication to be sent to?[facility] [phone]  3. Do they need a 30 day or 90 day supply? 180 and refills

## 2017-01-15 NOTE — Telephone Encounter (Signed)
Patiently directly notified

## 2017-01-15 NOTE — Telephone Encounter (Signed)
New message    Pt is calling because his prescription expires this month and he is going out of town tomorrow morning and would like a new prescription.   *STAT* If patient is at the pharmacy, call can be transferred to refill team.   1. Which medications need to be refilled? (please list name of each medication and dose if known) nitrostat   2. Which pharmacy/location (including street and city if local pharmacy) is medication to be sent to? [facility] on [address].  3. Do they need a 30 day or 90 day supply? 30 day

## 2017-02-18 ENCOUNTER — Ambulatory Visit (INDEPENDENT_AMBULATORY_CARE_PROVIDER_SITE_OTHER): Payer: Medicare Other | Admitting: *Deleted

## 2017-02-18 DIAGNOSIS — Z5181 Encounter for therapeutic drug level monitoring: Secondary | ICD-10-CM | POA: Diagnosis not present

## 2017-02-18 DIAGNOSIS — I480 Paroxysmal atrial fibrillation: Secondary | ICD-10-CM

## 2017-02-18 LAB — POCT INR: INR: 2.9

## 2017-02-18 NOTE — Patient Instructions (Signed)
Continue on same dosage 1 tablet every day except 1.5 tablets on Mondays and Wednesdays. Stay consistent with leafy greens-3 servings a week. Recheck INR in 6 weeks

## 2017-04-01 ENCOUNTER — Ambulatory Visit (INDEPENDENT_AMBULATORY_CARE_PROVIDER_SITE_OTHER): Payer: Medicare Other | Admitting: *Deleted

## 2017-04-01 DIAGNOSIS — Z5181 Encounter for therapeutic drug level monitoring: Secondary | ICD-10-CM

## 2017-04-01 DIAGNOSIS — I480 Paroxysmal atrial fibrillation: Secondary | ICD-10-CM

## 2017-04-01 LAB — POCT INR: INR: 3.2

## 2017-04-01 NOTE — Patient Instructions (Signed)
Description   Do not take coumadin today Jan 17th then continue on same dosage 1 tablet every day except 1.5 tablets on Mondays and Wednesdays. Stay consistent with leafy greens-3 servings a week. Recheck INR in 2 weeks.

## 2017-04-15 ENCOUNTER — Ambulatory Visit (INDEPENDENT_AMBULATORY_CARE_PROVIDER_SITE_OTHER): Payer: Medicare Other

## 2017-04-15 DIAGNOSIS — I480 Paroxysmal atrial fibrillation: Secondary | ICD-10-CM | POA: Diagnosis not present

## 2017-04-15 DIAGNOSIS — Z5181 Encounter for therapeutic drug level monitoring: Secondary | ICD-10-CM | POA: Diagnosis not present

## 2017-04-15 LAB — POCT INR: INR: 2.6

## 2017-04-15 NOTE — Patient Instructions (Signed)
Description   Continue on same dosage 1 tablet every day except 1.5 tablets on Mondays and Wednesdays.  Recheck INR in 3 weeks.

## 2017-05-06 ENCOUNTER — Ambulatory Visit (INDEPENDENT_AMBULATORY_CARE_PROVIDER_SITE_OTHER): Payer: Medicare Other | Admitting: *Deleted

## 2017-05-06 DIAGNOSIS — I480 Paroxysmal atrial fibrillation: Secondary | ICD-10-CM

## 2017-05-06 DIAGNOSIS — Z5181 Encounter for therapeutic drug level monitoring: Secondary | ICD-10-CM | POA: Diagnosis not present

## 2017-05-06 LAB — POCT INR: INR: 2.5

## 2017-05-06 NOTE — Patient Instructions (Signed)
Description   Continue on same dosage 1 tablet every day except 1.5 tablets on Mondays and Wednesdays.  Recheck INR in 4 weeks.

## 2017-05-31 ENCOUNTER — Other Ambulatory Visit: Payer: Self-pay | Admitting: Cardiology

## 2017-06-03 ENCOUNTER — Ambulatory Visit (INDEPENDENT_AMBULATORY_CARE_PROVIDER_SITE_OTHER): Payer: Medicare Other | Admitting: *Deleted

## 2017-06-03 DIAGNOSIS — Z5181 Encounter for therapeutic drug level monitoring: Secondary | ICD-10-CM | POA: Diagnosis not present

## 2017-06-03 DIAGNOSIS — I480 Paroxysmal atrial fibrillation: Secondary | ICD-10-CM

## 2017-06-03 LAB — POCT INR: INR: 2.6

## 2017-06-03 NOTE — Patient Instructions (Signed)
Description   Continue on same dosage 1 tablet every day except 1.5 tablets on Mondays and Wednesdays.  Recheck INR in 6 weeks.       

## 2017-06-06 IMAGING — DX DG FOOT COMPLETE 3+V*R*
3 series · 3 of 3 positions shown · non-contrast
Comparison: None.

CLINICAL DATA: Lateral aspect foot pain x 8-9 mos, NKI.

EXAM:
RIGHT FOOT COMPLETE - 3+ VIEW

[foot ap]
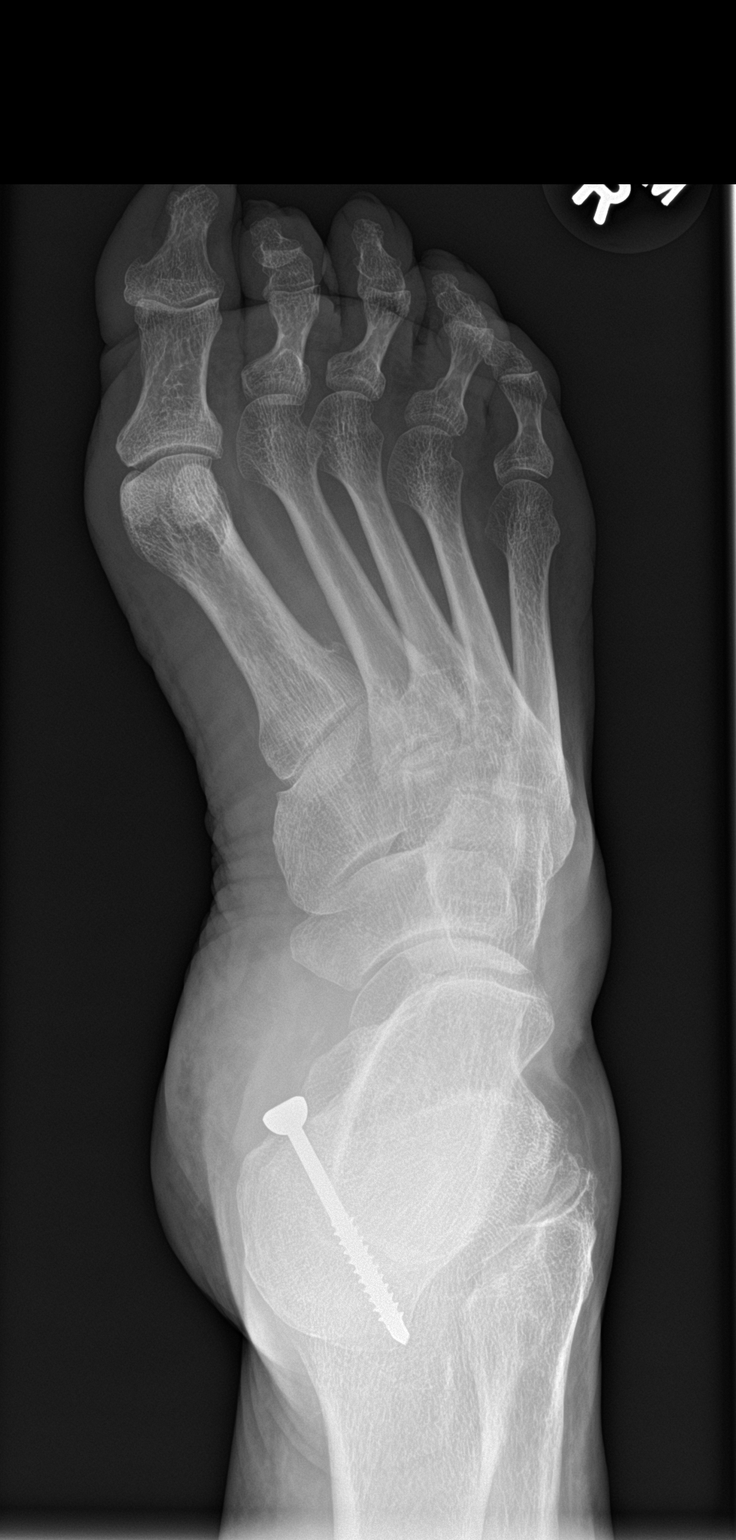

[foot obl]
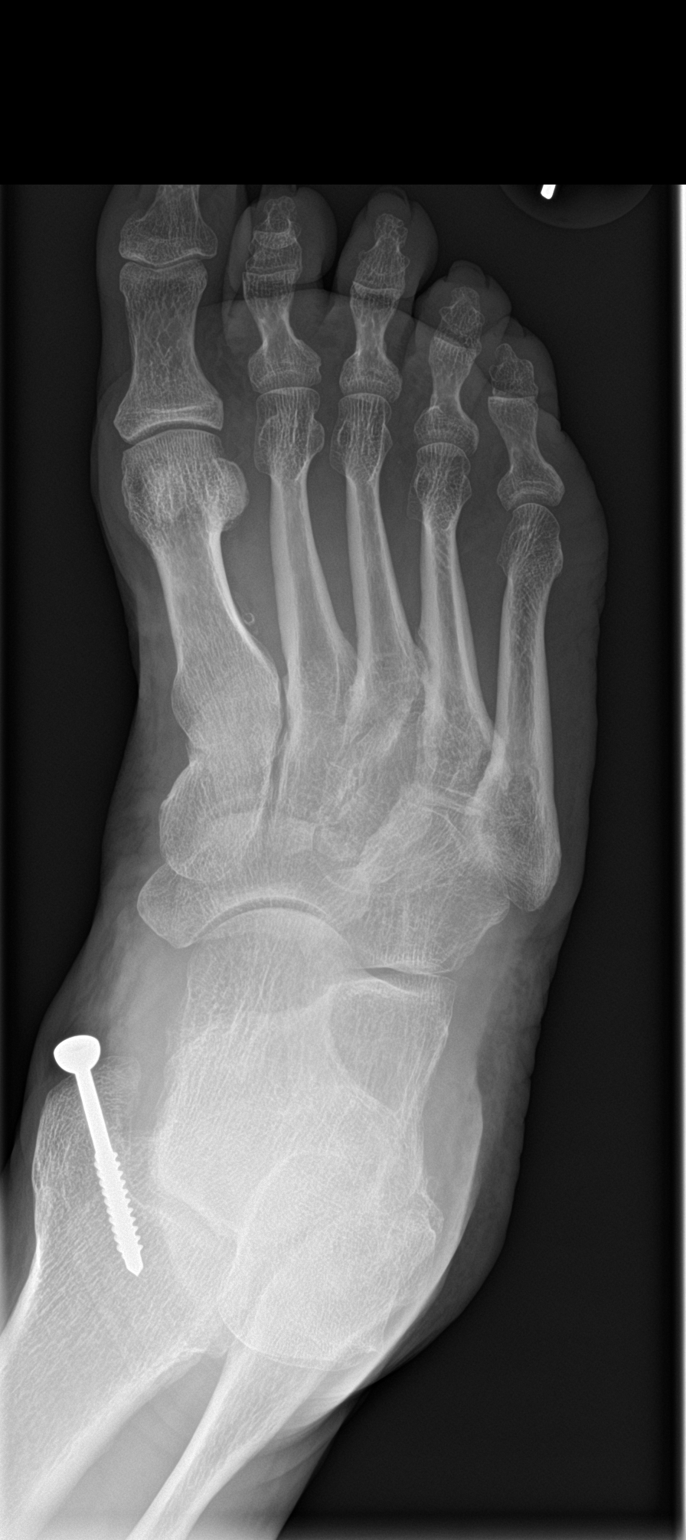

[foot lat]
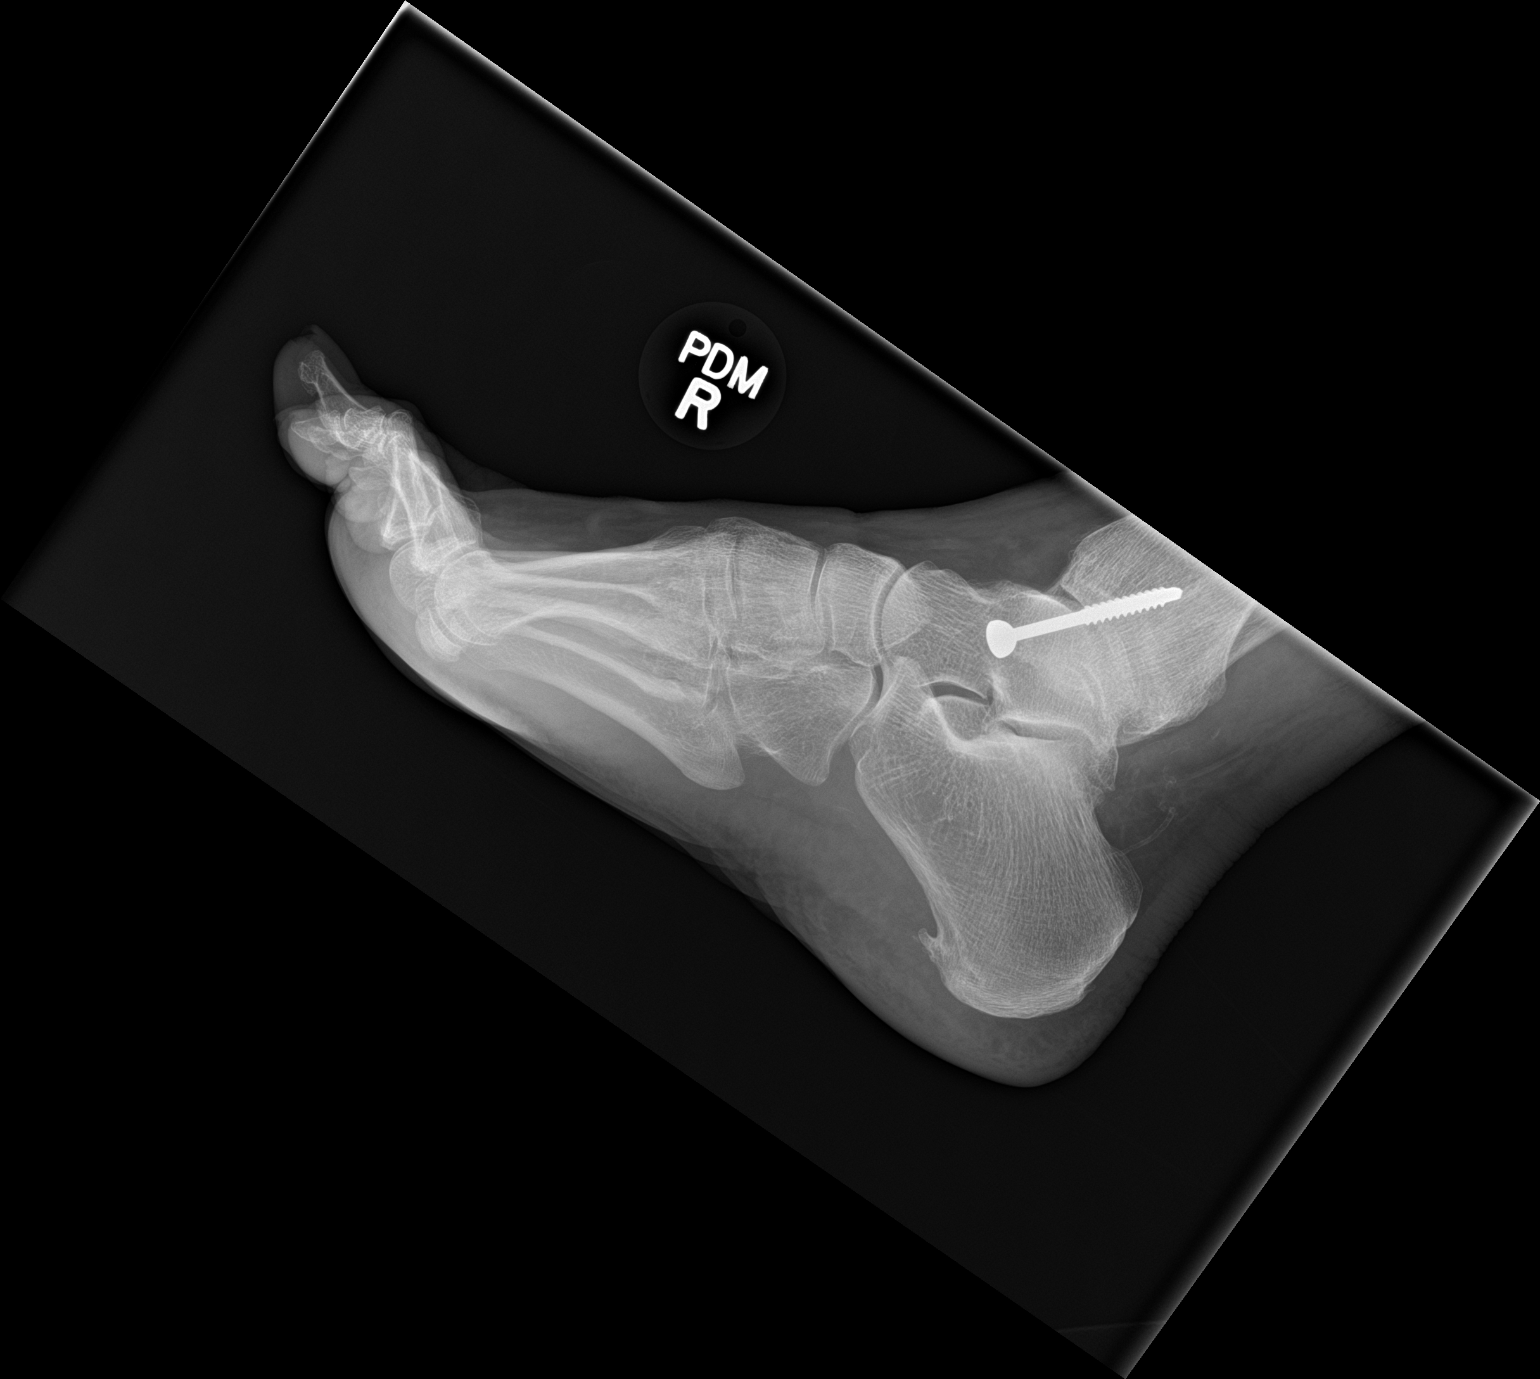

[3 of 3 positions shown; findings below may reference images not displayed]

FINDINGS: There is no evidence of fracture or dislocation. Screw traverses the
medial malleolus. A small plantar calcaneal spur is present. No
radiopaque foreign body or soft tissue gas.
IMPRESSION: No evidence for acute  abnormality.

## 2017-06-14 ENCOUNTER — Other Ambulatory Visit: Payer: Self-pay | Admitting: Internal Medicine

## 2017-06-17 ENCOUNTER — Other Ambulatory Visit: Payer: Self-pay

## 2017-06-17 ENCOUNTER — Telehealth: Payer: Self-pay | Admitting: Cardiovascular Disease

## 2017-06-17 MED ORDER — SIMVASTATIN 40 MG PO TABS: 40.0000 mg | ORAL_TABLET | Freq: Every day | ORAL | 1 refills | Status: DC

## 2017-06-17 NOTE — Telephone Encounter (Signed)
New Message:     *STAT* If patient is at the pharmacy, call can be transferred to refill team.   1. Which medications need to be refilled? (please list name of each medication and dose if known) simvastatin (ZOCOR) 40 MG tablet  2. Which pharmacy/location (including street and city if local pharmacy) is medication to be sent to? [facility] # [address] [place], [place]  3. Do they need a 30 day or 90 day supply? [name] [name]

## 2017-07-08 ENCOUNTER — Other Ambulatory Visit (INDEPENDENT_AMBULATORY_CARE_PROVIDER_SITE_OTHER): Payer: Medicare Other

## 2017-07-08 ENCOUNTER — Encounter: Payer: Self-pay | Admitting: Internal Medicine

## 2017-07-08 ENCOUNTER — Ambulatory Visit (INDEPENDENT_AMBULATORY_CARE_PROVIDER_SITE_OTHER): Payer: Medicare Other | Admitting: Internal Medicine

## 2017-07-08 VITALS — BP 124/84 | HR 78 | Temp 98.0°F | Ht 68.0 in | Wt 224.0 lb

## 2017-07-08 DIAGNOSIS — Z0000 Encounter for general adult medical examination without abnormal findings: Secondary | ICD-10-CM

## 2017-07-08 DIAGNOSIS — I10 Essential (primary) hypertension: Secondary | ICD-10-CM | POA: Diagnosis not present

## 2017-07-08 DIAGNOSIS — E039 Hypothyroidism, unspecified: Secondary | ICD-10-CM

## 2017-07-08 DIAGNOSIS — E7849 Other hyperlipidemia: Secondary | ICD-10-CM

## 2017-07-08 LAB — COMPREHENSIVE METABOLIC PANEL
ALBUMIN: 4.4 g/dL (ref 3.5–5.2)
ALT: 16 U/L (ref 0–53)
AST: 18 U/L (ref 0–37)
Alkaline Phosphatase: 66 U/L (ref 39–117)
BUN: 14 mg/dL (ref 6–23)
CHLORIDE: 104 meq/L (ref 96–112)
CO2: 27 mEq/L (ref 19–32)
Calcium: 9.4 mg/dL (ref 8.4–10.5)
Creatinine, Ser: 1.08 mg/dL (ref 0.40–1.50)
GFR: 70.92 mL/min (ref >60.00)
Glucose, Bld: 112 mg/dL — ABNORMAL HIGH (ref 70–99)
POTASSIUM: 4.1 meq/L (ref 3.5–5.1)
SODIUM: 139 meq/L (ref 135–145)
Total Bilirubin: 1.6 mg/dL — ABNORMAL HIGH (ref 0.2–1.2)
Total Protein: 7.2 g/dL (ref 6.0–8.3)

## 2017-07-08 LAB — CBC
HEMATOCRIT: 45.4 % (ref 39.0–52.0)
Hemoglobin: 16 g/dL (ref 13.0–17.0)
MCHC: 35.2 g/dL (ref 30.0–36.0)
MCV: 95.5 fl (ref 78.0–100.0)
Platelets: 230 10*3/uL (ref 150.0–400.0)
RBC: 4.75 Mil/uL (ref 4.22–5.81)
RDW: 15.6 % — ABNORMAL HIGH (ref 11.5–15.5)
WBC: 6.6 10*3/uL (ref 4.0–10.5)

## 2017-07-08 LAB — T4, FREE: Free T4: 0.97 ng/dL (ref 0.60–1.60)

## 2017-07-08 LAB — LIPID PANEL
CHOLESTEROL: 148 mg/dL (ref 0–200)
HDL: 47.4 mg/dL (ref >39.00)
LDL CALC: 80 mg/dL (ref 0–99)
NonHDL: 100.58
TRIGLYCERIDES: 105 mg/dL (ref 0.0–149.0)
Total CHOL/HDL Ratio: 3
VLDL: 21 mg/dL (ref 0.0–40.0)

## 2017-07-08 LAB — TSH: TSH: 7.35 u[IU]/mL — ABNORMAL HIGH (ref 0.35–4.50)

## 2017-07-08 NOTE — Assessment & Plan Note (Signed)
Flu shot yearly. Pneumonia done. Shingrix declines. Cologuard due in 2020. Counseled about sun safety and mole surveillance. Counseled about the dangers of distracted driving. Given 10 year screening recommendations.

## 2017-07-08 NOTE — Progress Notes (Signed)
   Subjective:    Patient ID: [name] [name], male    DOB: Apr 09, 1940, 77 y.o.   MRN: [id number]  HPI Here for medicare wellness, no new complaints. Please see A/P for status and treatment of chronic medical problems.   HPI #2: Here for follow up of cholesterol (taking simvastatin 40 mg daily, denies side effects, denies chest pains or muscle aches) and thyroid (taking synthroid, denies symptoms of over or under replacement, denies fatigue, weight change, dry skin).   Diet: heart healthy Physical activity: sedentary Depression/mood screen: negative Hearing: intact to whispered voice Visual acuity: grossly normal, performs annual eye exam  ADLs: capable Fall risk: none Home safety: good Cognitive evaluation: intact to orientation, naming, recall and repetition EOL planning: adv directives discussed  I have personally reviewed and have noted 1. The patient's medical and social history - reviewed today no changes 2. Their use of alcohol, tobacco or illicit drugs 3. Their current medications and supplements 4. The patient's functional ability including ADL's, fall risks, home safety risks and hearing or visual impairment. 5. Diet and physical activities 6. Evidence for depression or mood disorders 7. Care team reviewed and updated (available in snapshot)  Review of Systems  Constitutional: Negative.   HENT: Negative.   Eyes: Negative.   Respiratory: Negative for cough, chest tightness and shortness of breath.   Cardiovascular: Negative for chest pain, palpitations and leg swelling.  Gastrointestinal: Negative for abdominal distention, abdominal pain, constipation, diarrhea, nausea and vomiting.  Musculoskeletal: Negative.   Skin: Negative.   Neurological: Negative.   Psychiatric/Behavioral: Negative.       Objective:   Physical Exam  Constitutional: He is oriented to person, place, and time. He appears well-developed and well-nourished.  HENT:  Head: Normocephalic and  atraumatic.  Eyes: EOM are normal.  Neck: Normal range of motion.  Cardiovascular: Normal rate and regular rhythm.  Pulmonary/Chest: Effort normal and breath sounds normal. No respiratory distress. He has no wheezes. He has no rales.  Abdominal: Soft. Bowel sounds are normal. He exhibits no distension. There is no tenderness. There is no rebound.  Musculoskeletal: He exhibits no edema.  Neurological: He is alert and oriented to person, place, and time. Coordination normal.  Skin: Skin is warm and dry.  Psychiatric: He has a normal mood and affect.   Vitals:   07/08/17 0855  BP: 124/84  Pulse: 78  Temp: 98 F (36.7 C)  TempSrc: Oral  SpO2: 98%  Weight: 224 lb (101.6 kg)  Height: 5\' 8"  (1.727 m)      Assessment & Plan:

## 2017-07-08 NOTE — Assessment & Plan Note (Signed)
Taking coreg and BP at goal. Checking CMP and adjust as needed.

## 2017-07-08 NOTE — Assessment & Plan Note (Signed)
Checking lipid panel and adjust simvastatin if needed. He is willing to make diet and lifestyle changes if numbers are close to goal.

## 2017-07-08 NOTE — Assessment & Plan Note (Signed)
Checking TSH and free T4 and adjust as needed. Taking synthroid 50 mcg daily.  

## 2017-07-08 NOTE — Patient Instructions (Signed)

## 2017-07-15 ENCOUNTER — Ambulatory Visit (INDEPENDENT_AMBULATORY_CARE_PROVIDER_SITE_OTHER): Payer: Medicare Other | Admitting: *Deleted

## 2017-07-15 DIAGNOSIS — I480 Paroxysmal atrial fibrillation: Secondary | ICD-10-CM

## 2017-07-15 DIAGNOSIS — Z5181 Encounter for therapeutic drug level monitoring: Secondary | ICD-10-CM | POA: Diagnosis not present

## 2017-07-15 LAB — POCT INR: INR: 2.6

## 2017-07-15 NOTE — Patient Instructions (Signed)
Description   Continue on same dosage 1 tablet every day except 1.5 tablets on Mondays and Wednesdays.  Recheck INR in 6 weeks.       

## 2017-08-24 DIAGNOSIS — L219 Seborrheic dermatitis, unspecified: Secondary | ICD-10-CM | POA: Diagnosis not present

## 2017-08-24 DIAGNOSIS — L570 Actinic keratosis: Secondary | ICD-10-CM | POA: Diagnosis not present

## 2017-08-24 DIAGNOSIS — L409 Psoriasis, unspecified: Secondary | ICD-10-CM | POA: Diagnosis not present

## 2017-08-24 DIAGNOSIS — L821 Other seborrheic keratosis: Secondary | ICD-10-CM | POA: Diagnosis not present

## 2017-08-26 ENCOUNTER — Ambulatory Visit (INDEPENDENT_AMBULATORY_CARE_PROVIDER_SITE_OTHER): Payer: Medicare Other | Admitting: *Deleted

## 2017-08-26 DIAGNOSIS — Z5181 Encounter for therapeutic drug level monitoring: Secondary | ICD-10-CM

## 2017-08-26 DIAGNOSIS — I480 Paroxysmal atrial fibrillation: Secondary | ICD-10-CM | POA: Diagnosis not present

## 2017-08-26 LAB — POCT INR: INR: 2.5 (ref 2.0–3.0)

## 2017-08-26 NOTE — Patient Instructions (Signed)
Description   Continue on same dosage 1 tablet every day except 1.5 tablets on Mondays and Wednesdays.  Recheck INR in 6 weeks.       

## 2017-10-04 ENCOUNTER — Other Ambulatory Visit: Payer: Self-pay | Admitting: Internal Medicine

## 2017-10-05 ENCOUNTER — Encounter: Payer: Self-pay | Admitting: Cardiovascular Disease

## 2017-10-05 ENCOUNTER — Ambulatory Visit (INDEPENDENT_AMBULATORY_CARE_PROVIDER_SITE_OTHER): Payer: Medicare Other | Admitting: Cardiovascular Disease

## 2017-10-05 ENCOUNTER — Ambulatory Visit (INDEPENDENT_AMBULATORY_CARE_PROVIDER_SITE_OTHER): Payer: Medicare Other

## 2017-10-05 VITALS — BP 122/82 | HR 75 | Ht 69.0 in | Wt 227.2 lb

## 2017-10-05 DIAGNOSIS — E785 Hyperlipidemia, unspecified: Secondary | ICD-10-CM | POA: Diagnosis not present

## 2017-10-05 DIAGNOSIS — I480 Paroxysmal atrial fibrillation: Secondary | ICD-10-CM

## 2017-10-05 DIAGNOSIS — I2510 Atherosclerotic heart disease of native coronary artery without angina pectoris: Secondary | ICD-10-CM | POA: Diagnosis not present

## 2017-10-05 DIAGNOSIS — I10 Essential (primary) hypertension: Secondary | ICD-10-CM | POA: Diagnosis not present

## 2017-10-05 DIAGNOSIS — Z5181 Encounter for therapeutic drug level monitoring: Secondary | ICD-10-CM

## 2017-10-05 LAB — POCT INR: INR: 2.8 (ref 2.0–3.0)

## 2017-10-05 MED ORDER — ATORVASTATIN CALCIUM 40 MG PO TABS: 40.0000 mg | ORAL_TABLET | Freq: Every day | ORAL | 3 refills | Status: DC

## 2017-10-05 NOTE — Patient Instructions (Signed)
Description   Continue on same dosage 1 tablet every day except 1.5 tablets on Mondays and Wednesdays.  Recheck INR in 6 weeks.       

## 2017-10-05 NOTE — Patient Instructions (Signed)
Medication Instructions: Stop the Simvastatin START Atorvastatin 40 mg daily  If you need a refill on your cardiac medications before your next appointment, please call your pharmacy.   Labwork: Your provider would like for you to return in 2 months to have the following labs drawn: FASTING lipid and liver. You do not need an appointment for the lab. Once in our office lobby there is a podium where you can sign in and ring the doorbell to alert [place] that you are here. The lab is open from 8:00 am to 4:30 pm; closed for lunch from 12:45pm-1:45pm.   Follow-Up: Your physician wants you to follow-up in 12 months with Dr. Fletcher Anon. You will receive a reminder letter in the mail two months in advance. If you don't receive a letter, please call our office at [phone] to schedule this follow-up appointment.   Thank you for choosing [facility] at [facility]!!

## 2017-10-05 NOTE — Progress Notes (Signed)
Cardiology Office Note   Date:  10/05/2017   ID:  [name] [name], DOB 09-07-1940, MRN [id number]  PCP:  Hoyt Koch, MD  Cardiologist:   Kathlyn Sacramento, MD   No chief complaint on file.     History of Present Illness: [name] [name] is a 77 y.o. male who presents for a follow up visit regarding CAD and paroxysmal atrial fibrillation.   He has known history of coronary artery disease status post PCI in 2005 and paroxysmal atrial fibrillation. He is on long-term anticoagulation with warfarin with no reported side effects.   He is noted to be in atrial fibrillation today but denies any symptoms.  He is usually asymptomatic when he goes into A. fib.  No chest pain, shortness of breath or palpitations.   Past Medical History:  Diagnosis Date  . (HFpEF) heart failure with preserved ejection fraction ([place])   . Atrial fibrillation (HCC)    Paroxysmal, chronic anticoag  . BCC (basal cell carcinoma), lip 09/2013  . CAD (coronary artery disease) 2005   PCI ([place])  . HLD (hyperlipidemia)   . HTN (hypertension)   . Hypothyroidism   . Kidney stones   . Myocardial infarction, nontransmural, subendocardial 2005  . Psoriasis   . Seizures ([place])    x1 in the 1970s, no AEDs since mid 1970s    Past Surgical History:  Procedure Laterality Date  . BASAL CELL CARCINOMA EXCISION  09/2013  . CARDIAC CATHETERIZATION  2005   RCA: 99% mid. RPL: 70% ostial,   . CORONARY ANGIOPLASTY WITH STENT PLACEMENT  2005   RCA and RPL ([facility])  . HERNIA REPAIR  2008  . [place]  . LIPOMA EXCISION Right 09/21/2013   Procedure: LIP BIOPSY WITH FROZEN SECTION AND WEDGE EXCISION OF RIGHT UPPER LIP ;  Surgeon: Izora Gala, MD;  Location: [facility];  Service: ENT;  Laterality: Right;  . SKIN CANCER EXCISION  2009   right upper lip/nares  . TIBIA FRACTURE SURGERY  1982   right     Current Outpatient Medications  Medication Sig Dispense Refill  . carvedilol (COREG) 12.5 MG tablet  Take 1 tablet (12.5 mg total) by mouth 2 (two) times daily with a meal. 180 tablet 3  . Glucosamine-Chondroit-Vit C-Mn (GLUCOSAMINE 1500 COMPLEX PO) Take 1 tablet by mouth 2 (two) times daily.     Marland Kitchen levothyroxine (SYNTHROID, LEVOTHROID) 50 MCG tablet TAKE 1 TABLET BY MOUTH ONCE DAILY 90 tablet 2  . nitroGLYCERIN (NITROSTAT) 0.4 MG SL tablet Place 1 tablet (0.4 mg total) under the tongue every 5 (five) minutes as needed for chest pain. 25 tablet 3  . triamcinolone cream (KENALOG) 0.1 %   0  . warfarin (COUMADIN) 5 MG tablet TAKE AS DIRECTED 120 tablet 1   No current facility-administered medications for this visit.     Allergies:   Patient has no known allergies.    Social History:  The patient  reports that he quit smoking about 24 years ago. He has a 9.00 pack-year smoking history. He has never used smokeless tobacco. He reports that he drinks about 1.2 oz of alcohol per week. He reports that he does not use drugs.   Family History:  The patient's family history includes Atrial fibrillation in his brother, mother, and sister; Heart attack in his father; Heart failure in his unknown relative; Hypertension in his father.    ROS:  Please see the history of present illness.   Otherwise, review of  systems are positive for none.   All other systems are reviewed and negative.    PHYSICAL EXAM: VS:  BP 122/82 (BP Location: Right Arm)   Pulse 75   Ht 5\' 9"  (1.753 m)   Wt 227 lb 3.2 oz (103.1 kg)   BMI 33.55 kg/m  , BMI Body mass index is 33.55 kg/m. GEN: Well nourished, well developed, in no acute distress  HEENT: normal  Neck: no JVD, carotid bruits, or masses Cardiac: RRR; no murmurs, rubs, or gallops,no edema  Respiratory:  clear to auscultation bilaterally, normal work of breathing GI: soft, nontender, nondistended, + BS MS: no deformity or atrophy  Skin: warm and dry, no rash Neuro:  Strength and sensation are intact Psych: euthymic mood, full affect   EKG:  EKG is ordered  today. The ekg ordered today demonstrates atrial fibrillation with poor R wave progression in the anterior leads.  Recent Labs: 07/08/2017: ALT 16; BUN 14; Creatinine, Ser 1.08; Hemoglobin 16.0; Platelets 230.0; Potassium 4.1; Sodium 139; TSH 7.35    Lipid Panel    Component Value Date/Time   CHOL 148 07/08/2017 0931   TRIG 105.0 07/08/2017 0931   HDL 47.40 07/08/2017 0931   CHOLHDL 3 07/08/2017 0931   VLDL 21.0 07/08/2017 0931   LDLCALC 80 07/08/2017 0931      Wt Readings from Last 3 Encounters:  10/05/17 227 lb 3.2 oz (103.1 kg)  07/08/17 224 lb (101.6 kg)  09/29/16 212 lb 9.6 oz (96.4 kg)      No flowsheet data found.    ASSESSMENT AND PLAN:  1.  Paroxysmal atrial fibrillation: He is noted to be in atrial fibrillation today but he has no symptoms.  Ventricular rate is controlled on carvedilol.  He is on long-term anticoagulation with warfarin and does not want to switch to any other anticoagulant.  2. Coronary artery disease involving native coronary arteries without angina: Continue medical therapy.  3. Hyperlipidemia: I reviewed most recent lipid profile done in April which showed slight worsening of LDL to 80.  Given known history of coronary artery disease, I recommend a target LDL of less than 70 and thus I switched simvastatin to atorvastatin 40 mg once daily.  Repeat lipid and liver profile in 2 months.   Disposition:   FU with me in 1 year  Signed,  Kathlyn Sacramento, MD  10/05/2017 9:33 AM    [place]

## 2017-11-18 ENCOUNTER — Ambulatory Visit (INDEPENDENT_AMBULATORY_CARE_PROVIDER_SITE_OTHER): Payer: Medicare Other | Admitting: *Deleted

## 2017-11-18 DIAGNOSIS — I480 Paroxysmal atrial fibrillation: Secondary | ICD-10-CM | POA: Diagnosis not present

## 2017-11-18 DIAGNOSIS — Z5181 Encounter for therapeutic drug level monitoring: Secondary | ICD-10-CM | POA: Diagnosis not present

## 2017-11-18 LAB — POCT INR: INR: 2.6 (ref 2.0–3.0)

## 2017-11-18 NOTE — Patient Instructions (Signed)
Description   Continue on same dosage 1 tablet every day except 1.5 tablets on Mondays and Wednesdays.  Recheck INR in 6 weeks.       

## 2017-12-06 ENCOUNTER — Other Ambulatory Visit: Payer: Self-pay

## 2017-12-06 DIAGNOSIS — E785 Hyperlipidemia, unspecified: Secondary | ICD-10-CM | POA: Diagnosis not present

## 2017-12-06 DIAGNOSIS — I10 Essential (primary) hypertension: Secondary | ICD-10-CM | POA: Diagnosis not present

## 2017-12-06 LAB — HEPATIC FUNCTION PANEL
ALT: 16 IU/L (ref 0–44)
AST: 18 IU/L (ref 0–40)
Albumin: 4.2 g/dL (ref 3.5–4.8)
Alkaline Phosphatase: 81 IU/L (ref 39–117)
BILIRUBIN TOTAL: 1.5 mg/dL — AB (ref 0.0–1.2)
Bilirubin, Direct: 0.37 mg/dL (ref 0.00–0.40)
Total Protein: 6.2 g/dL (ref 6.0–8.5)

## 2017-12-06 LAB — LIPID PANEL
CHOLESTEROL TOTAL: 119 mg/dL (ref 100–199)
Chol/HDL Ratio: 2.7 ratio (ref 0.0–5.0)
HDL: 44 mg/dL (ref >39)
LDL Calculated: 60 mg/dL (ref 0–99)
TRIGLYCERIDES: 76 mg/dL (ref 0–149)
VLDL Cholesterol Cal: 15 mg/dL (ref 5–40)

## 2017-12-26 ENCOUNTER — Other Ambulatory Visit: Payer: Self-pay | Admitting: Cardiology

## 2017-12-27 ENCOUNTER — Other Ambulatory Visit: Payer: Self-pay | Admitting: Cardiology

## 2017-12-30 ENCOUNTER — Ambulatory Visit (INDEPENDENT_AMBULATORY_CARE_PROVIDER_SITE_OTHER): Payer: Medicare Other | Admitting: Pharmacist

## 2017-12-30 DIAGNOSIS — Z5181 Encounter for therapeutic drug level monitoring: Secondary | ICD-10-CM | POA: Diagnosis not present

## 2017-12-30 DIAGNOSIS — I480 Paroxysmal atrial fibrillation: Secondary | ICD-10-CM | POA: Diagnosis not present

## 2017-12-30 LAB — POCT INR: INR: 2.2 (ref 2.0–3.0)

## 2017-12-30 NOTE — Patient Instructions (Signed)
Description   Continue on same dosage 1 tablet every day except 1.5 tablets on Mondays and Wednesdays.  Recheck INR in 6 weeks.       

## 2018-01-13 DIAGNOSIS — Z23 Encounter for immunization: Secondary | ICD-10-CM | POA: Diagnosis not present

## 2018-01-14 ENCOUNTER — Other Ambulatory Visit: Payer: Self-pay | Admitting: Cardiovascular Disease

## 2018-01-14 DIAGNOSIS — I480 Paroxysmal atrial fibrillation: Secondary | ICD-10-CM

## 2018-01-14 NOTE — Telephone Encounter (Signed)
Refill Request.  

## 2018-02-14 ENCOUNTER — Ambulatory Visit (INDEPENDENT_AMBULATORY_CARE_PROVIDER_SITE_OTHER): Payer: Medicare Other | Admitting: Pharmacist

## 2018-02-14 DIAGNOSIS — I480 Paroxysmal atrial fibrillation: Secondary | ICD-10-CM

## 2018-02-14 DIAGNOSIS — Z5181 Encounter for therapeutic drug level monitoring: Secondary | ICD-10-CM

## 2018-02-14 LAB — POCT INR: INR: 2.8 (ref 2.0–3.0)

## 2018-02-14 NOTE — Patient Instructions (Signed)
Description   Continue on same dosage 1 tablet every day except 1.5 tablets on Mondays and Wednesdays.  Recheck INR in 6 weeks.

## 2018-03-31 ENCOUNTER — Ambulatory Visit (INDEPENDENT_AMBULATORY_CARE_PROVIDER_SITE_OTHER): Payer: Medicare Other

## 2018-03-31 DIAGNOSIS — I480 Paroxysmal atrial fibrillation: Secondary | ICD-10-CM

## 2018-03-31 DIAGNOSIS — Z5181 Encounter for therapeutic drug level monitoring: Secondary | ICD-10-CM

## 2018-03-31 LAB — POCT INR: INR: 2.3 (ref 2.0–3.0)

## 2018-03-31 NOTE — Patient Instructions (Signed)
Description   Continue on same dosage 1 tablet every day except 1.5 tablets on Mondays and Wednesdays.  Recheck INR in 6 weeks.

## 2018-05-12 ENCOUNTER — Ambulatory Visit (INDEPENDENT_AMBULATORY_CARE_PROVIDER_SITE_OTHER): Payer: Medicare Other | Admitting: Pharmacist

## 2018-05-12 DIAGNOSIS — Z5181 Encounter for therapeutic drug level monitoring: Secondary | ICD-10-CM | POA: Diagnosis not present

## 2018-05-12 DIAGNOSIS — I480 Paroxysmal atrial fibrillation: Secondary | ICD-10-CM

## 2018-05-12 LAB — POCT INR: INR: 2.1 (ref 2.0–3.0)

## 2018-05-12 NOTE — Patient Instructions (Signed)
Take 1.5 tablets tonight, then continue on same dosage 1 tablet every day except 1.5 tablets on Mondays and Wednesdays.  Recheck INR in 6 weeks.

## 2018-06-11 ENCOUNTER — Other Ambulatory Visit: Payer: Self-pay | Admitting: Internal Medicine

## 2018-06-22 ENCOUNTER — Telehealth: Payer: Self-pay

## 2018-06-22 NOTE — Telephone Encounter (Signed)
1. Do you currently have a fever? no 2. Have you recently travelled on a cruise, internationally, or to [place], [place], [place], [place], [place], or [place], [place] [employer]) ? No 3. Have you been in contact with someone that is currently pending confirmation of Covid19 testing or has been confirmed to have the [place] virus?  No 4. Are you currently experiencing fatigue or cough? No  Pt. Advised that we are restricting visitors at this time and anyone present in the vehicle should meet the above criteria as well. Advised that visit will be at curbside for finger stick ONLY and will receive call with instructions. Pt also advised to please bring own pen for signature of arrival document.

## 2018-06-22 NOTE — Telephone Encounter (Signed)
lmom for prescreen/drive thru 

## 2018-06-23 ENCOUNTER — Other Ambulatory Visit: Payer: Self-pay

## 2018-06-23 ENCOUNTER — Ambulatory Visit (INDEPENDENT_AMBULATORY_CARE_PROVIDER_SITE_OTHER): Payer: Medicare Other | Admitting: Pharmacist

## 2018-06-23 DIAGNOSIS — Z5181 Encounter for therapeutic drug level monitoring: Secondary | ICD-10-CM

## 2018-06-23 DIAGNOSIS — I480 Paroxysmal atrial fibrillation: Secondary | ICD-10-CM | POA: Diagnosis not present

## 2018-06-23 LAB — POCT INR: INR: 2.5 (ref 2.0–3.0)

## 2018-08-03 ENCOUNTER — Other Ambulatory Visit: Payer: Self-pay | Admitting: Cardiology

## 2018-08-15 ENCOUNTER — Telehealth: Payer: Self-pay

## 2018-08-15 NOTE — Telephone Encounter (Signed)
lmom for prescreen  

## 2018-08-18 ENCOUNTER — Other Ambulatory Visit: Payer: Self-pay

## 2018-08-18 ENCOUNTER — Ambulatory Visit (INDEPENDENT_AMBULATORY_CARE_PROVIDER_SITE_OTHER): Payer: Medicare Other | Admitting: *Deleted

## 2018-08-18 DIAGNOSIS — Z5181 Encounter for therapeutic drug level monitoring: Secondary | ICD-10-CM

## 2018-08-18 DIAGNOSIS — I480 Paroxysmal atrial fibrillation: Secondary | ICD-10-CM | POA: Diagnosis not present

## 2018-08-18 LAB — POCT INR: INR: 2.3 (ref 2.0–3.0)

## 2018-08-18 NOTE — Patient Instructions (Signed)
Description   Continue on same dosage 1 tablet every day except 1.5 tablets on Mondays and Wednesdays.  Recheck INR in 8 weeks.

## 2018-09-28 ENCOUNTER — Other Ambulatory Visit: Payer: Self-pay | Admitting: Cardiovascular Disease

## 2018-09-28 NOTE — Telephone Encounter (Signed)
Please review for refill, Thanks !  

## 2018-10-03 ENCOUNTER — Telehealth: Payer: Self-pay | Admitting: Internal Medicine

## 2018-10-04 ENCOUNTER — Ambulatory Visit: Payer: Medicare Other | Admitting: Internal Medicine

## 2018-10-06 NOTE — Telephone Encounter (Signed)
Patient calling in wondering why medication was denied. Patient states he has an appointment the 28th. States he has enough till the appointment.

## 2018-10-07 NOTE — Telephone Encounter (Addendum)
Pt calling back again today requesting   levothyroxine (SYNTHROID, LEVOTHROID) 50 MCG tablet  Be sent to the pharmacy. Pt has appt 7/28. Pt states he needs a call back by the end of the day to have his resolved. Pt concerned he will run out. So he would like Rx sent to   [facility], [place] [phone] (Phone) [phone] (Fax)   Pt states the office changed his appt , he did not, and this would not be an issue if his appt had not been changed.

## 2018-10-10 MED ORDER — LEVOTHYROXINE SODIUM 50 MCG PO TABS: 50.0000 ug | ORAL_TABLET | Freq: Every day | ORAL | 0 refills | Status: DC

## 2018-10-10 NOTE — Telephone Encounter (Signed)
Sent in refill appointment for tomorrow

## 2018-10-10 NOTE — Addendum Note (Signed)
Addended by: Raford Pitcher R on: 10/10/2018 08:59 AM   Modules accepted: Orders

## 2018-10-11 ENCOUNTER — Other Ambulatory Visit: Payer: Self-pay

## 2018-10-11 ENCOUNTER — Ambulatory Visit (INDEPENDENT_AMBULATORY_CARE_PROVIDER_SITE_OTHER): Payer: Medicare Other | Admitting: Internal Medicine

## 2018-10-11 ENCOUNTER — Encounter: Payer: Self-pay | Admitting: Internal Medicine

## 2018-10-11 ENCOUNTER — Telehealth: Payer: Self-pay | Admitting: *Deleted

## 2018-10-11 ENCOUNTER — Other Ambulatory Visit (INDEPENDENT_AMBULATORY_CARE_PROVIDER_SITE_OTHER): Payer: Medicare Other

## 2018-10-11 VITALS — BP 122/84 | HR 68 | Temp 97.9°F | Ht 69.0 in | Wt 223.0 lb

## 2018-10-11 DIAGNOSIS — E782 Mixed hyperlipidemia: Secondary | ICD-10-CM | POA: Diagnosis not present

## 2018-10-11 DIAGNOSIS — R7301 Impaired fasting glucose: Secondary | ICD-10-CM

## 2018-10-11 DIAGNOSIS — E039 Hypothyroidism, unspecified: Secondary | ICD-10-CM

## 2018-10-11 DIAGNOSIS — I2510 Atherosclerotic heart disease of native coronary artery without angina pectoris: Secondary | ICD-10-CM

## 2018-10-11 DIAGNOSIS — I10 Essential (primary) hypertension: Secondary | ICD-10-CM

## 2018-10-11 DIAGNOSIS — I4811 Longstanding persistent atrial fibrillation: Secondary | ICD-10-CM

## 2018-10-11 DIAGNOSIS — Z0000 Encounter for general adult medical examination without abnormal findings: Secondary | ICD-10-CM | POA: Diagnosis not present

## 2018-10-11 LAB — COMPREHENSIVE METABOLIC PANEL
ALT: 23 U/L (ref 0–53)
AST: 22 U/L (ref 0–37)
Albumin: 4.6 g/dL (ref 3.5–5.2)
Alkaline Phosphatase: 75 U/L (ref 39–117)
BUN: 16 mg/dL (ref 6–23)
CO2: 28 mEq/L (ref 19–32)
Calcium: 9.3 mg/dL (ref 8.4–10.5)
Chloride: 104 mEq/L (ref 96–112)
Creatinine, Ser: 0.85 mg/dL (ref 0.40–1.50)
GFR: 87.67 mL/min (ref >60.00)
Glucose, Bld: 109 mg/dL — ABNORMAL HIGH (ref 70–99)
Potassium: 4 mEq/L (ref 3.5–5.1)
Sodium: 140 mEq/L (ref 135–145)
Total Bilirubin: 1.8 mg/dL — ABNORMAL HIGH (ref 0.2–1.2)
Total Protein: 7.1 g/dL (ref 6.0–8.3)

## 2018-10-11 LAB — CBC
HCT: 43.6 % (ref 39.0–52.0)
Hemoglobin: 15 g/dL (ref 13.0–17.0)
MCHC: 34.4 g/dL (ref 30.0–36.0)
MCV: 96.8 fl (ref 78.0–100.0)
Platelets: 225 10*3/uL (ref 150.0–400.0)
RBC: 4.51 Mil/uL (ref 4.22–5.81)
RDW: 15.8 % — ABNORMAL HIGH (ref 11.5–15.5)
WBC: 6.2 10*3/uL (ref 4.0–10.5)

## 2018-10-11 LAB — LIPID PANEL
Cholesterol: 128 mg/dL (ref 0–200)
HDL: 48.4 mg/dL (ref >39.00)
LDL Cholesterol: 62 mg/dL (ref 0–99)
NonHDL: 79.46
Total CHOL/HDL Ratio: 3
Triglycerides: 85 mg/dL (ref 0.0–149.0)
VLDL: 17 mg/dL (ref 0.0–40.0)

## 2018-10-11 LAB — TSH: TSH: 5.77 u[IU]/mL — ABNORMAL HIGH (ref 0.35–4.50)

## 2018-10-11 LAB — HEMOGLOBIN A1C: Hgb A1c MFr Bld: 4.8 % (ref 4.6–6.5)

## 2018-10-11 LAB — T4, FREE: Free T4: 0.91 ng/dL (ref 0.60–1.60)

## 2018-10-11 NOTE — Progress Notes (Signed)
Subjective:   Patient ID: [name] [name], male    DOB: [birth year], 78 y.o.   MRN: [id number]  HPI Here for medicare wellness, no new complaints. Please see A/P for status and treatment of chronic medical problems.   HPI #2: Here for follow up cholesterol (cardiology switched to lipitor due to higher LDL last year, he is doing well on that, denies side effects, denies chest pains or stroke symptoms), and thyroid (taking synthroid, denies missing, takes separate from food or other meds, denies heat or cold intolerance), and A fib (rate controlled on coreg, taking warfarin for anticoagulation, cardiology manages INR, most recent at goal). Denies new problems.   Diet: heart healthy Physical activity: sedentary Depression/mood screen: negative Hearing: intact to whispered voice Visual acuity: grossly normal with lens, performs annual eye exam  ADLs: capable Fall risk: none Home safety: good Cognitive evaluation: intact to orientation, naming, recall and repetition EOL planning: adv directives discussed    Office Visit from 10/11/2018 in [place]  PHQ-2 Total Score  0      I have personally reviewed and have noted 1. The patient's medical and social history - reviewed today no changes 2. Their use of alcohol, tobacco or illicit drugs 3. Their current medications and supplements 4. The patient's functional ability including ADL's, fall risks, home safety risks and hearing or visual impairment. 5. Diet and physical activities 6. Evidence for depression or mood disorders 7. Care team reviewed and updated  Patient Care Team: Hoyt Koch, MD as PCP - General (Internal Medicine) Larey Dresser, MD (Cardiology) Heath Lark, MD (Hematology and Oncology) Izora Gala, MD (Otolaryngology) Past Medical History:  Diagnosis Date  . (HFpEF) heart failure with preserved ejection fraction ([place])   . Atrial fibrillation (HCC)    Paroxysmal, chronic  anticoag  . BCC (basal cell carcinoma), lip 09/2013  . CAD (coronary artery disease) 2005   PCI ([place])  . HLD (hyperlipidemia)   . HTN (hypertension)   . Hypothyroidism   . Kidney stones   . Myocardial infarction, nontransmural, subendocardial 2005  . Psoriasis   . Seizures ([place])    x1 in the 1970s, no AEDs since mid 1970s   Past Surgical History:  Procedure Laterality Date  . BASAL CELL CARCINOMA EXCISION  09/2013  . CARDIAC CATHETERIZATION  2005   RCA: 99% mid. RPL: 70% ostial,   . CORONARY ANGIOPLASTY WITH STENT PLACEMENT  2005   RCA and RPL ([facility])  . HERNIA REPAIR  2008  . [place]  . LIPOMA EXCISION Right 09/21/2013   Procedure: LIP BIOPSY WITH FROZEN SECTION AND WEDGE EXCISION OF RIGHT UPPER LIP ;  Surgeon: Izora Gala, MD;  Location: [facility];  Service: ENT;  Laterality: Right;  . SKIN CANCER EXCISION  2009   right upper lip/nares  . TIBIA FRACTURE SURGERY  1982   right   Family History  Problem Relation Age of Onset  . Atrial fibrillation Mother   . Heart attack Father   . Hypertension Father   . Heart failure Other   . Atrial fibrillation Sister        s/p RFA  . Atrial fibrillation Brother        s/p RFA    Review of Systems  Constitutional: Negative.   HENT: Negative.   Eyes: Negative.   Respiratory: Negative for cough, chest tightness and shortness of breath.   Cardiovascular: Negative for chest pain, palpitations and leg swelling.  Gastrointestinal:  Negative for abdominal distention, abdominal pain, constipation, diarrhea, nausea and vomiting.  Musculoskeletal: Negative.   Skin: Negative.   Neurological: Negative.   Psychiatric/Behavioral: Negative.     Objective:  Physical Exam Constitutional:      Appearance: He is well-developed.  HENT:     Head: Normocephalic and atraumatic.  Neck:     Musculoskeletal: Normal range of motion.  Cardiovascular:     Rate and Rhythm: Normal rate. Rhythm irregular.  Pulmonary:     Effort:  Pulmonary effort is normal. No respiratory distress.     Breath sounds: Normal breath sounds. No wheezing or rales.  Abdominal:     General: Bowel sounds are normal. There is no distension.     Palpations: Abdomen is soft.     Tenderness: There is no abdominal tenderness. There is no rebound.  Skin:    General: Skin is warm and dry.  Neurological:     Mental Status: He is alert and oriented to person, place, and time.     Coordination: Coordination normal.     Vitals:   10/11/18 0754  BP: 122/84  Pulse: 68  Temp: 97.9 F (36.6 C)  TempSrc: Oral  SpO2: 99%  Weight: 223 lb (101.2 kg)  Height: 5\' 9"  (1.753 m)    Assessment & Plan:

## 2018-10-11 NOTE — Assessment & Plan Note (Signed)
Statin changed to lipitor last year with cardiology. Checking lipid panel and adjust as needed. No chest pain currently.

## 2018-10-11 NOTE — Assessment & Plan Note (Signed)
BP at goal on coreg. Checking CMP and adjust as needed.

## 2018-10-11 NOTE — Patient Instructions (Signed)

## 2018-10-11 NOTE — Assessment & Plan Note (Signed)
Checking TSH and free T4 and adjust synthroid 50 mcg daily as needed.  

## 2018-10-11 NOTE — Assessment & Plan Note (Signed)
Flu shot yearly. Pneumonia complete. Shingrix counseled. Tetanus due in 2025. Cologuard due Sept 2020 and he agrees to do this. Counseled about sun safety and mole surveillance. Counseled about the dangers of distracted driving. Given 10 year screening recommendations.

## 2018-10-11 NOTE — Assessment & Plan Note (Signed)
Checking lipid panel and adjust lipitor as needed.  

## 2018-10-11 NOTE — Telephone Encounter (Signed)

## 2018-10-11 NOTE — Assessment & Plan Note (Signed)
Likely in a fib today. Recent INR at goal. On coreg for rate control and HR at goal.

## 2018-10-13 ENCOUNTER — Other Ambulatory Visit: Payer: Self-pay

## 2018-10-13 ENCOUNTER — Ambulatory Visit (INDEPENDENT_AMBULATORY_CARE_PROVIDER_SITE_OTHER): Payer: Medicare Other | Admitting: *Deleted

## 2018-10-13 DIAGNOSIS — I480 Paroxysmal atrial fibrillation: Secondary | ICD-10-CM | POA: Diagnosis not present

## 2018-10-13 DIAGNOSIS — Z5181 Encounter for therapeutic drug level monitoring: Secondary | ICD-10-CM | POA: Diagnosis not present

## 2018-10-13 LAB — POCT INR: INR: 2.6 (ref 2.0–3.0)

## 2018-10-13 NOTE — Patient Instructions (Signed)
Description   Continue on same dosage 1 tablet every day except 1.5 tablets on Mondays and Wednesdays.  Recheck INR in 8 weeks.  [facility] [phone].     

## 2018-11-01 ENCOUNTER — Other Ambulatory Visit: Payer: Self-pay

## 2018-11-01 ENCOUNTER — Ambulatory Visit (INDEPENDENT_AMBULATORY_CARE_PROVIDER_SITE_OTHER): Payer: Medicare Other | Admitting: Cardiovascular Disease

## 2018-11-01 ENCOUNTER — Encounter: Payer: Self-pay | Admitting: Cardiovascular Disease

## 2018-11-01 VITALS — BP 140/78 | HR 68 | Temp 97.2°F | Ht 67.0 in | Wt 225.0 lb

## 2018-11-01 DIAGNOSIS — E785 Hyperlipidemia, unspecified: Secondary | ICD-10-CM

## 2018-11-01 DIAGNOSIS — I4821 Permanent atrial fibrillation: Secondary | ICD-10-CM

## 2018-11-01 DIAGNOSIS — I2510 Atherosclerotic heart disease of native coronary artery without angina pectoris: Secondary | ICD-10-CM | POA: Diagnosis not present

## 2018-11-01 NOTE — Patient Instructions (Signed)
Medication Instructions:  Your physician recommends that you continue on your current medications as directed. Please refer to the Current Medication list given to you today.  If you need a refill on your cardiac medications before your next appointment, please call your pharmacy.   Lab work: None ordered If you have labs (blood work) drawn today and your tests are completely normal, you will receive your results only by: MyChart Message (if you have MyChart) OR A paper copy in the mail If you have any lab test that is abnormal or we need to change your treatment, we will call you to review the results.  Testing/Procedures: None ordered  Follow-Up: At [facility], you and your health needs are our priority.  As part of our continuing mission to provide you with exceptional heart care, we have created designated Provider Care Teams.  These Care Teams include your primary Cardiologist (physician) and Advanced Practice Providers (APPs -  Physician Assistants and Nurse Practitioners) who all work together to provide you with the care you need, when you need it. You will need a follow up appointment in 12 months.  Please call our office 2 months in advance to schedule this appointment.  You may see Muhammad Arida, MD or one of the following Advanced Practice Providers on your designated Care Team:   Luke Kilroy, PA-C Krista Kroeger, PA-C Callie Goodrich, PA-C        

## 2018-11-01 NOTE — Progress Notes (Signed)
onthsd

## 2018-11-01 NOTE — Progress Notes (Signed)
Cardiology Office Note   Date:  11/01/2018   ID:  [name] [name], DOB 08-27-1940, MRN [id number]  PCP:  Hoyt Koch, MD  Cardiologist:   Kathlyn Sacramento, MD   Chief Complaint  Patient presents with  . Follow-up    12 months.      History of Present Illness: [name] [name] is a 78 y.o. male who presents for a follow up visit regarding CAD and chronic atrial fibrillation.   He has known history of coronary artery disease status post PCI in 2005 and chronicatrial fibrillation. He is on long-term anticoagulation with warfarin with no reported side effects.   He previously had paroxysmal atrial fibrillation and was noted during his visit last year to be in atrial fibrillation but was asymptomatic.  We continue with rate control strategy and he continues to be in atrial fibrillation today but again he has no symptoms.  He denies chest pain, shortness of breath or palpitations.  I switched him to atorvastatin last year with improvement in LDL.  He continues to be active and does some renovation projects around his house.   Past Medical History:  Diagnosis Date  . (HFpEF) heart failure with preserved ejection fraction ([place])   . Atrial fibrillation (HCC)    Paroxysmal, chronic anticoag  . BCC (basal cell carcinoma), lip 09/2013  . CAD (coronary artery disease) 2005   PCI ([place])  . HLD (hyperlipidemia)   . HTN (hypertension)   . Hypothyroidism   . Kidney stones   . Myocardial infarction, nontransmural, subendocardial 2005  . Psoriasis   . Seizures ([place])    x1 in the 1970s, no AEDs since mid 1970s    Past Surgical History:  Procedure Laterality Date  . BASAL CELL CARCINOMA EXCISION  09/2013  . CARDIAC CATHETERIZATION  2005   RCA: 99% mid. RPL: 70% ostial,   . CORONARY ANGIOPLASTY WITH STENT PLACEMENT  2005   RCA and RPL ([facility])  . HERNIA REPAIR  2008  . [place]  . LIPOMA EXCISION Right 09/21/2013   Procedure: LIP BIOPSY WITH FROZEN SECTION AND  WEDGE EXCISION OF RIGHT UPPER LIP ;  Surgeon: Izora Gala, MD;  Location: [facility];  Service: ENT;  Laterality: Right;  . SKIN CANCER EXCISION  2009   right upper lip/nares  . TIBIA FRACTURE SURGERY  1982   right     Current Outpatient Medications  Medication Sig Dispense Refill  . atorvastatin (LIPITOR) 40 MG tablet TAKE ONE TABLET BY MOUTH ONE TIME DAILY  90 tablet 0  . carvedilol (COREG) 12.5 MG tablet TAKE 1 TABLET BY MOUTH TWICE DAILY WITH MEALS 180 tablet 3  . Glucosamine-Chondroit-Vit C-Mn (GLUCOSAMINE 1500 COMPLEX PO) Take 1 tablet by mouth 2 (two) times daily.     Marland Kitchen levothyroxine (SYNTHROID) 50 MCG tablet Take 1 tablet (50 mcg total) by mouth daily. 90 tablet 0  . nitroGLYCERIN (NITROSTAT) 0.4 MG SL tablet PLACE ONE TABLET UNDER THE TONGUE EVERY 5 MINUTES AS NEEDED FOR CHEST PAIN 25 tablet 0  . triamcinolone cream (KENALOG) 0.1 %   0  . warfarin (COUMADIN) 5 MG tablet TAKE AS DIRECTED 120 tablet 1   No current facility-administered medications for this visit.     Allergies:   Patient has no known allergies.    Social History:  The patient  reports that he quit smoking about 25 years ago. He has a 9.00 pack-year smoking history. He has never used smokeless tobacco. He reports current alcohol  use of about 2.0 standard drinks of alcohol per week. He reports that he does not use drugs.   Family History:  The patient's family history includes Atrial fibrillation in his brother, mother, and sister; Heart attack in his father; Heart failure in an other family member; Hypertension in his father.    ROS:  Please see the history of present illness.   Otherwise, review of systems are positive for none.   All other systems are reviewed and negative.    PHYSICAL EXAM: VS:  BP 140/78 (BP Location: Left Arm, Patient Position: Sitting, Cuff Size: Normal)   Pulse 68   Temp (!) 97.2 F (36.2 C)   Ht 5\' 7"  (1.702 m)   Wt 225 lb (102.1 kg)   BMI 35.24 kg/m  , BMI Body mass index is 35.24  kg/m. GEN: Well nourished, well developed, in no acute distress  HEENT: normal  Neck: no JVD, carotid bruits, or masses Cardiac: RRR; no murmurs, rubs, or gallops,no edema  Respiratory:  clear to auscultation bilaterally, normal work of breathing GI: soft, nontender, nondistended, + BS MS: no deformity or atrophy  Skin: warm and dry, no rash Neuro:  Strength and sensation are intact Psych: euthymic mood, full affect   EKG:  EKG is ordered today. The ekg ordered today demonstrates atrial fibrillation with ventricular rate of 68 bpm.  Poor R wave progression anterior leads.  Recent Labs: 10/11/2018: ALT 23; BUN 16; Creatinine, Ser 0.85; Hemoglobin 15.0; Platelets 225.0; Potassium 4.0; Sodium 140; TSH 5.77    Lipid Panel    Component Value Date/Time   CHOL 128 10/11/2018 0825   CHOL 119 12/06/2017 0818   TRIG 85.0 10/11/2018 0825   HDL 48.40 10/11/2018 0825   HDL 44 12/06/2017 0818   CHOLHDL 3 10/11/2018 0825   VLDL 17.0 10/11/2018 0825   LDLCALC 62 10/11/2018 0825   LDLCALC 60 12/06/2017 0818      Wt Readings from Last 3 Encounters:  11/01/18 225 lb (102.1 kg)  10/11/18 223 lb (101.2 kg)  10/05/17 227 lb 3.2 oz (103.1 kg)      No flowsheet data found.    ASSESSMENT AND PLAN:  1.  Chronic atrial fibrillation: He likely transition to chronic atrial fibrillation.  He continues to be asymptomatic.  Continue rate control with carvedilol.  Continue long-term anticoagulation with warfarin with a target INR between 2 and 3.    2. Coronary artery disease involving native coronary arteries without angina: Continue medical therapy.  3. Hyperlipidemia: Continue atorvastatin.  I reviewed most recent labs done on July 28 which showed an LDL of 62 and triglyceride of 85.  4.  The patient mentioned possible need for dental extractions in the near future.  He is at low risk from a cardiac standpoint and warfarin can be held 3 days before extraction if needed.   Disposition:   FU  with me in 1 year  Signed,  Kathlyn Sacramento, MD  11/01/2018 10:27 AM    [place]

## 2018-11-11 DIAGNOSIS — L821 Other seborrheic keratosis: Secondary | ICD-10-CM | POA: Diagnosis not present

## 2018-11-11 DIAGNOSIS — L409 Psoriasis, unspecified: Secondary | ICD-10-CM | POA: Diagnosis not present

## 2018-11-11 DIAGNOSIS — Z85828 Personal history of other malignant neoplasm of skin: Secondary | ICD-10-CM | POA: Diagnosis not present

## 2018-12-08 ENCOUNTER — Ambulatory Visit (INDEPENDENT_AMBULATORY_CARE_PROVIDER_SITE_OTHER): Payer: Medicare Other | Admitting: *Deleted

## 2018-12-08 ENCOUNTER — Other Ambulatory Visit: Payer: Self-pay

## 2018-12-08 ENCOUNTER — Ambulatory Visit (INDEPENDENT_AMBULATORY_CARE_PROVIDER_SITE_OTHER): Payer: Medicare Other

## 2018-12-08 DIAGNOSIS — Z5181 Encounter for therapeutic drug level monitoring: Secondary | ICD-10-CM

## 2018-12-08 DIAGNOSIS — Z23 Encounter for immunization: Secondary | ICD-10-CM

## 2018-12-08 DIAGNOSIS — I480 Paroxysmal atrial fibrillation: Secondary | ICD-10-CM | POA: Diagnosis not present

## 2018-12-08 LAB — POCT INR: INR: 2.7 (ref 2.0–3.0)

## 2018-12-08 NOTE — Patient Instructions (Signed)
Description   Continue on same dosage 1 tablet every day except 1.5 tablets on Mondays and Wednesdays.  Recheck INR in 8 weeks.  [facility] [phone].     

## 2018-12-14 ENCOUNTER — Other Ambulatory Visit: Payer: Self-pay | Admitting: Internal Medicine

## 2018-12-14 ENCOUNTER — Encounter: Payer: Self-pay | Admitting: Internal Medicine

## 2018-12-14 DIAGNOSIS — Z1211 Encounter for screening for malignant neoplasm of colon: Secondary | ICD-10-CM

## 2018-12-26 ENCOUNTER — Other Ambulatory Visit: Payer: Self-pay | Admitting: Cardiovascular Disease

## 2018-12-26 ENCOUNTER — Encounter: Payer: Self-pay | Admitting: Internal Medicine

## 2018-12-26 NOTE — Telephone Encounter (Signed)
Please review for refill. Thank you! 

## 2019-01-02 ENCOUNTER — Other Ambulatory Visit: Payer: Self-pay | Admitting: Internal Medicine

## 2019-01-03 ENCOUNTER — Encounter: Payer: Self-pay | Admitting: Internal Medicine

## 2019-01-09 DIAGNOSIS — Z1212 Encounter for screening for malignant neoplasm of rectum: Secondary | ICD-10-CM | POA: Diagnosis not present

## 2019-01-09 DIAGNOSIS — Z1211 Encounter for screening for malignant neoplasm of colon: Secondary | ICD-10-CM | POA: Diagnosis not present

## 2019-01-14 LAB — COLOGUARD: Cologuard: NEGATIVE

## 2019-01-16 ENCOUNTER — Encounter: Payer: Self-pay | Admitting: Internal Medicine

## 2019-01-16 NOTE — Progress Notes (Signed)
Abstracted and sent to scan  

## 2019-01-23 ENCOUNTER — Other Ambulatory Visit: Payer: Self-pay | Admitting: Cardiovascular Disease

## 2019-01-23 DIAGNOSIS — I480 Paroxysmal atrial fibrillation: Secondary | ICD-10-CM

## 2019-01-23 NOTE — Telephone Encounter (Signed)
Refill Request.  

## 2019-01-23 NOTE — Telephone Encounter (Signed)
Please review for refill. Thank you! 

## 2019-02-02 ENCOUNTER — Other Ambulatory Visit: Payer: Self-pay

## 2019-02-02 ENCOUNTER — Ambulatory Visit (INDEPENDENT_AMBULATORY_CARE_PROVIDER_SITE_OTHER): Payer: Medicare Other | Admitting: *Deleted

## 2019-02-02 DIAGNOSIS — Z5181 Encounter for therapeutic drug level monitoring: Secondary | ICD-10-CM

## 2019-02-02 DIAGNOSIS — I480 Paroxysmal atrial fibrillation: Secondary | ICD-10-CM

## 2019-02-02 LAB — POCT INR: INR: 2.8 (ref 2.0–3.0)

## 2019-02-02 NOTE — Patient Instructions (Signed)
Description   Continue on same dosage 1 tablet every day except 1.5 tablets on Mondays and Wednesdays.  Recheck INR in 8 weeks.  [facility] [phone].     

## 2019-02-06 ENCOUNTER — Other Ambulatory Visit: Payer: Self-pay

## 2019-03-06 ENCOUNTER — Other Ambulatory Visit: Payer: Self-pay | Admitting: Cardiology

## 2019-03-30 ENCOUNTER — Ambulatory Visit (INDEPENDENT_AMBULATORY_CARE_PROVIDER_SITE_OTHER): Payer: Medicare Other | Admitting: Pharmacist

## 2019-03-30 ENCOUNTER — Encounter: Payer: Self-pay | Admitting: Pharmacist

## 2019-03-30 ENCOUNTER — Other Ambulatory Visit: Payer: Self-pay

## 2019-03-30 ENCOUNTER — Ambulatory Visit: Payer: Medicare Other | Attending: Internal Medicine

## 2019-03-30 DIAGNOSIS — I480 Paroxysmal atrial fibrillation: Secondary | ICD-10-CM

## 2019-03-30 DIAGNOSIS — Z23 Encounter for immunization: Secondary | ICD-10-CM | POA: Insufficient documentation

## 2019-03-30 DIAGNOSIS — Z5181 Encounter for therapeutic drug level monitoring: Secondary | ICD-10-CM | POA: Diagnosis not present

## 2019-03-30 LAB — POCT INR: INR: 2.5 (ref 2.0–3.0)

## 2019-03-30 NOTE — Patient Instructions (Signed)
Description   Continue on same dosage 1 tablet every day except 1.5 tablets on Mondays and Wednesdays.  Recheck INR in 8 weeks.  [facility] [phone].     

## 2019-03-30 NOTE — Progress Notes (Signed)
   Covid-19 Vaccination Clinic  Name:  [name] [name]    MRN: [id number] DOB: [birth year]  03/30/2019  [name] [name] was observed post Covid-19 immunization for 30 minutes based on pre-vaccination screening without incidence. He was provided with Vaccine Information Sheet and instruction to access the V-Safe system.   [name] [name] was instructed to call 911 with any severe reactions post vaccine: Marland Kitchen Difficulty breathing  . Swelling of your face and throat  . A fast heartbeat  . A bad rash all over your body  . Dizziness and weakness    Immunizations Administered    Name Date Dose VIS Date Route   Pfizer COVID-19 Vaccine 03/30/2019  9:38 AM 0.3 mL 02/24/2019 Intramuscular   Manufacturer: [address]   Lot: [id number]   [place]: [id number]

## 2019-04-17 ENCOUNTER — Ambulatory Visit: Payer: Medicare Other | Attending: Internal Medicine

## 2019-04-17 DIAGNOSIS — Z23 Encounter for immunization: Secondary | ICD-10-CM | POA: Insufficient documentation

## 2019-04-17 NOTE — Progress Notes (Signed)
   Covid-19 Vaccination Clinic  Name:  [name] [name]    MRN: [id number] DOB: 20-Apr-1940  04/17/2019  [name] was observed post Covid-19 immunization for 15 minutes without incidence. He was provided with Vaccine Information Sheet and instruction to access the V-Safe system.   [name] was instructed to call 911 with any severe reactions post vaccine: Marland Kitchen Difficulty breathing  . Swelling of your face and throat  . A fast heartbeat  . A bad rash all over your body  . Dizziness and weakness    Immunizations Administered    Name Date Dose VIS Date Route   Pfizer COVID-19 Vaccine 04/17/2019  9:16 AM 0.3 mL 02/24/2019 Intramuscular   Manufacturer: [place]   Lot: [id number]   [place]: [id number]

## 2019-05-25 ENCOUNTER — Other Ambulatory Visit: Payer: Self-pay

## 2019-05-25 ENCOUNTER — Ambulatory Visit (INDEPENDENT_AMBULATORY_CARE_PROVIDER_SITE_OTHER): Payer: Medicare Other | Admitting: *Deleted

## 2019-05-25 DIAGNOSIS — Z5181 Encounter for therapeutic drug level monitoring: Secondary | ICD-10-CM

## 2019-05-25 DIAGNOSIS — I480 Paroxysmal atrial fibrillation: Secondary | ICD-10-CM | POA: Diagnosis not present

## 2019-05-25 LAB — POCT INR: INR: 2.2 (ref 2.0–3.0)

## 2019-05-25 NOTE — Patient Instructions (Signed)
Description   Continue on same dosage 1 tablet every day except 1.5 tablets on Mondays and Wednesdays.  Recheck INR in 8 weeks.  [facility] [phone].     

## 2019-06-26 ENCOUNTER — Other Ambulatory Visit: Payer: Self-pay | Admitting: Cardiovascular Disease

## 2019-06-26 NOTE — Telephone Encounter (Signed)
Please review for refill. Thanks!  

## 2019-07-17 ENCOUNTER — Other Ambulatory Visit: Payer: Self-pay | Admitting: Cardiovascular Disease

## 2019-07-17 DIAGNOSIS — I480 Paroxysmal atrial fibrillation: Secondary | ICD-10-CM

## 2019-07-17 NOTE — Telephone Encounter (Signed)
Refill Request.  

## 2019-07-19 ENCOUNTER — Other Ambulatory Visit: Payer: Self-pay | Admitting: Cardiovascular Disease

## 2019-07-19 DIAGNOSIS — I480 Paroxysmal atrial fibrillation: Secondary | ICD-10-CM

## 2019-07-19 MED ORDER — CARVEDILOL 12.5 MG PO TABS: 12.5000 mg | ORAL_TABLET | Freq: Two times a day (BID) | ORAL | 0 refills | Status: DC

## 2019-07-19 NOTE — Telephone Encounter (Signed)
*  STAT* If patient is at the pharmacy, call can be transferred to refill team.   1. Which medications need to be refilled? (please list name of each medication and dose if known) carvedilol (COREG) 12.5 MG tablet  2. Which pharmacy/location (including street and city if local pharmacy) is medication to be sent to? [place], [place]  3. Do they need a 30 day or 90 day supply? 90   Pt is out of medication

## 2019-07-20 ENCOUNTER — Ambulatory Visit (INDEPENDENT_AMBULATORY_CARE_PROVIDER_SITE_OTHER): Payer: Medicare Other | Admitting: *Deleted

## 2019-07-20 ENCOUNTER — Other Ambulatory Visit: Payer: Self-pay

## 2019-07-20 DIAGNOSIS — I480 Paroxysmal atrial fibrillation: Secondary | ICD-10-CM | POA: Diagnosis not present

## 2019-07-20 DIAGNOSIS — Z5181 Encounter for therapeutic drug level monitoring: Secondary | ICD-10-CM | POA: Diagnosis not present

## 2019-07-20 LAB — POCT INR: INR: 2.4 (ref 2.0–3.0)

## 2019-07-20 MED ORDER — CARVEDILOL 12.5 MG PO TABS: 12.5000 mg | ORAL_TABLET | Freq: Two times a day (BID) | ORAL | 0 refills | Status: DC

## 2019-07-20 NOTE — Telephone Encounter (Signed)
Pt calling stating that his medication was sent to the wrong pharmacy, it should have been sent to [facility] in [place], I resend pt medication to the correct pharmacy as requested. Confirmation received. I advised pt that if he has any other problems, questions or concerns, to please give our office a call. Pt verbalized understanding.

## 2019-07-20 NOTE — Patient Instructions (Signed)
Description   Continue on same dosage 1 tablet every day except 1.5 tablets on Mondays and Wednesdays.  Recheck INR in 8 weeks.  [facility] [phone].     

## 2019-07-20 NOTE — Addendum Note (Signed)
Addended by: Derl Barrow on: 07/20/2019 01:46 PM   Modules accepted: Orders

## 2019-09-14 ENCOUNTER — Other Ambulatory Visit: Payer: Self-pay

## 2019-09-14 ENCOUNTER — Ambulatory Visit (INDEPENDENT_AMBULATORY_CARE_PROVIDER_SITE_OTHER): Payer: Medicare Other | Admitting: *Deleted

## 2019-09-14 DIAGNOSIS — I480 Paroxysmal atrial fibrillation: Secondary | ICD-10-CM

## 2019-09-14 DIAGNOSIS — Z5181 Encounter for therapeutic drug level monitoring: Secondary | ICD-10-CM | POA: Diagnosis not present

## 2019-09-14 LAB — POCT INR: INR: 2.8 (ref 2.0–3.0)

## 2019-09-14 NOTE — Patient Instructions (Signed)
Description   Continue on same dosage 1 tablet every day except 1.5 tablets on Mondays and Wednesdays.  Recheck INR in 8 weeks.  [facility] [phone].     

## 2019-09-27 ENCOUNTER — Other Ambulatory Visit: Payer: Self-pay | Admitting: Cardiovascular Disease

## 2019-09-27 ENCOUNTER — Encounter (HOSPITAL_COMMUNITY): Payer: Self-pay

## 2019-09-27 ENCOUNTER — Ambulatory Visit (HOSPITAL_COMMUNITY)
Admission: EM | Admit: 2019-09-27 | Discharge: 2019-09-27 | Disposition: A | Discharge status: Home / Self Care | Payer: Medicare Other | Attending: Internal Medicine | Admitting: Internal Medicine

## 2019-09-27 DIAGNOSIS — Z23 Encounter for immunization: Secondary | ICD-10-CM | POA: Diagnosis not present

## 2019-09-27 DIAGNOSIS — S61316A Laceration without foreign body of right little finger with damage to nail, initial encounter: Secondary | ICD-10-CM

## 2019-09-27 MED ORDER — TETANUS-DIPHTH-ACELL PERTUSSIS 5-2.5-18.5 LF-MCG/0.5 IM SUSP
0.5000 mL | Freq: Once | INTRAMUSCULAR | Status: AC
  Administered 2019-09-27: 0.5 mL via INTRAMUSCULAR

## 2019-09-27 MED ORDER — BACITRACIN ZINC 500 UNIT/GM EX OINT
TOPICAL_OINTMENT | CUTANEOUS | Status: AC
  Filled 2019-09-27: qty 0.9

## 2019-09-27 MED ORDER — TETANUS-DIPHTHERIA TOXOIDS TD 5-2 LFU IM INJ: 0.5000 mL | INJECTION | Freq: Once | INTRAMUSCULAR | Status: DC

## 2019-09-27 MED ORDER — TETANUS-DIPHTH-ACELL PERTUSSIS 5-2.5-18.5 LF-MCG/0.5 IM SUSP
INTRAMUSCULAR | Status: AC
  Filled 2019-09-27: qty 0.5

## 2019-09-27 NOTE — ED Triage Notes (Signed)
Patient presents here today with a right pinky laceration that he did this afternoon. Patient states he was slicing onion with a mandolin and accidentally slice the corner of his right pinky finger.    Last Td 12/21/2013

## 2019-09-27 NOTE — Discharge Instructions (Signed)
Area clean and dry.  Keep bleeding controlled with bandage.  Please return or seek medical care for any fever or chills, increased swelling or redness to area

## 2019-09-27 NOTE — Telephone Encounter (Signed)
Refill request

## 2019-09-27 NOTE — ED Provider Notes (Signed)
[place]    CSN: [id number] Arrival date & time: 09/27/19  [place]      History   Chief Complaint Chief Complaint  Patient presents with  . Laceration    HPI [name] [name] is a 79 y.o. male with past medical history of atrial fibrillation on Coumadin presents to urgent care after cutting finger. Patient states he was slicing onion on mandolin and accidentally cut right little finger on blade. Patient able to control bleeding prior to arrival. Denies any numbness or tingling to affected extremity. Uncertain of last tetanus shot.   Past Medical History:  Diagnosis Date  . (HFpEF) heart failure with preserved ejection fraction ([place])   . Atrial fibrillation (HCC)    Paroxysmal, chronic anticoag  . BCC (basal cell carcinoma), lip 09/2013  . CAD (coronary artery disease) 2005   PCI ([place])  . HLD (hyperlipidemia)   . HTN (hypertension)   . Hypothyroidism   . Kidney stones   . Myocardial infarction, nontransmural, subendocardial 2005  . Psoriasis   . Seizures ([place])    x1 in the 1970s, no AEDs since mid 1970s    Patient Active Problem List   Diagnosis Date Noted  . Right foot pain 01/10/2016  . Routine general medical examination at a health care facility 06/27/2015  . Hypothyroidism 12/21/2013  . Poor dentition 08/14/2013  . Atrial fibrillation ([place]) 02/17/2011  . CAD (coronary artery disease)   . HLD (hyperlipidemia)   . HTN (hypertension)     Past Surgical History:  Procedure Laterality Date  . BASAL CELL CARCINOMA EXCISION  09/2013  . CARDIAC CATHETERIZATION  2005   RCA: 99% mid. RPL: 70% ostial,   . CORONARY ANGIOPLASTY WITH STENT PLACEMENT  2005   RCA and RPL ([facility])  . HERNIA REPAIR  2008  . [place]  . LIPOMA EXCISION Right 09/21/2013   Procedure: LIP BIOPSY WITH FROZEN SECTION AND WEDGE EXCISION OF RIGHT UPPER LIP ;  Surgeon: Izora Gala, MD;  Location: [facility];  Service: ENT;  Laterality: Right;  . SKIN CANCER EXCISION  2009    right upper lip/nares  . TIBIA FRACTURE SURGERY  1982   right       Home Medications    Prior to Admission medications   Medication Sig Start Date End Date Taking? Authorizing Provider  atorvastatin (LIPITOR) 40 MG tablet TAKE ONE TABLET BY MOUTH ONE TIME DAILY  06/27/19   Wellington Hampshire, MD  carvedilol (COREG) 12.5 MG tablet Take 1 tablet (12.5 mg total) by mouth 2 (two) times daily with a meal. 07/20/19   Wellington Hampshire, MD  Glucosamine-Chondroit-Vit C-Mn (GLUCOSAMINE 1500 COMPLEX PO) Take 1 tablet by mouth 2 (two) times daily.     [provider]  levothyroxine (SYNTHROID) 50 MCG tablet Take 1 tablet by mouth once daily 01/02/19   Hoyt Koch, MD  nitroGLYCERIN (NITROSTAT) 0.4 MG SL tablet PLACE ONE TABLET UNDER THE TONGUE EVERY 5 MINUTES AS NEEDED FOR CHEST PAIN 09/29/18   Wellington Hampshire, MD  triamcinolone cream (KENALOG) 0.1 %  06/08/16   [provider]  warfarin (COUMADIN) 5 MG tablet Take as directed by Coumadin Clinic 03/06/19   Larey Dresser, MD    Family History Family History  Problem Relation Age of Onset  . Atrial fibrillation Mother   . Heart attack Father   . Hypertension Father   . Heart failure Other   . Atrial fibrillation Sister  s/p RFA  . Atrial fibrillation Brother        s/p RFA    Social History Social History   Tobacco Use  . Smoking status: Former Smoker    Packs/day: 0.30    Years: 30.00    Pack years: 9.00    Quit date: 01/14/1993    Years since quitting: 26.7  . Smokeless tobacco: Never Used  Vaping Use  . Vaping Use: Never used  Substance Use Topics  . Alcohol use: Yes    Alcohol/week: 2.0 standard drinks    Types: 2 Cans of beer per week  . Drug use: No     Allergies   Patient has no known allergies.   Review of Systems As stated in HPI otherwise negative   Physical Exam Triage Vital Signs ED Triage Vitals  Enc Vitals Group     BP 09/27/19 2011 (!) 171/108     Pulse Rate  09/27/19 2011 (!) 101     Resp 09/27/19 2011 20     Temp --      Temp Source 09/27/19 2011 Oral     SpO2 09/27/19 2011 97 %     Weight --      Height --      Head Circumference --      Peak Flow --      Pain Score 09/27/19 2013 0     Pain Loc --      Pain Edu? --      Excl. in [place]? --    No data found.  Updated Vital Signs BP (!) 182/126 (BP Location: Right Arm) Comment: Patient states he has not taken his evening medications yet.  Pulse 99   Resp 18   SpO2 99%      Physical Exam   UC Treatments / Results  Labs (all labs ordered are listed, but only abnormal results are displayed) Labs Reviewed - No data to display  EKG   Radiology No results found.  Procedures Procedures (including critical care time)  Medications Ordered in UC Medications  Tdap (BOOSTRIX) injection 0.5 mL (0.5 mLs Intramuscular Given 09/27/19 2047)    Initial Impression / Assessment and Plan / UC Course  I have reviewed the triage vital signs and the nursing notes.  Pertinent labs & imaging results that were available during my care of the patient were reviewed by me and considered in my medical decision making (see chart for details).  Laceration to right little finger -More of an avulsion as chunk of tissue missing from top of pinky finger with portion of nail. No appreciable laceration to suture. No bone involvement or foreign bodies. -Area cleaned and pressure dressing applied -Patient instructed to monitor for any signs or symptoms of infection including fever/chills, increased swelling or redness to area. No indication for antibiotics at this time -Tdap administered  Final Clinical Impressions(s) / UC Diagnoses   Final diagnoses:  Laceration of right little finger without foreign body with damage to nail, initial encounter     Discharge Instructions     Area clean and dry.  Keep bleeding controlled with bandage.  Please return or seek medical care for any fever or chills,  increased swelling or redness to area    ED Prescriptions    None     PDMP not reviewed this encounter.   Rudolpho Sevin, NP 09/27/19 2136

## 2019-09-28 ENCOUNTER — Other Ambulatory Visit: Payer: Self-pay | Admitting: Internal Medicine

## 2019-09-29 ENCOUNTER — Telehealth: Payer: Self-pay | Admitting: Cardiovascular Disease

## 2019-09-29 NOTE — Telephone Encounter (Signed)
    I went in pt's chart to see if his Atorvastatin had been called in. It had been called in on 09-28-19.i

## 2019-10-04 ENCOUNTER — Other Ambulatory Visit: Payer: Self-pay | Admitting: Cardiology

## 2019-10-16 ENCOUNTER — Other Ambulatory Visit: Payer: Self-pay

## 2019-10-16 ENCOUNTER — Other Ambulatory Visit: Payer: Self-pay | Admitting: Cardiovascular Disease

## 2019-10-16 ENCOUNTER — Ambulatory Visit (INDEPENDENT_AMBULATORY_CARE_PROVIDER_SITE_OTHER): Payer: Medicare Other | Admitting: Internal Medicine

## 2019-10-16 ENCOUNTER — Encounter: Payer: Self-pay | Admitting: Internal Medicine

## 2019-10-16 VITALS — BP 118/78 | HR 69 | Temp 98.0°F | Ht 67.0 in | Wt 223.0 lb

## 2019-10-16 DIAGNOSIS — I10 Essential (primary) hypertension: Secondary | ICD-10-CM | POA: Diagnosis not present

## 2019-10-16 DIAGNOSIS — Z0000 Encounter for general adult medical examination without abnormal findings: Secondary | ICD-10-CM | POA: Diagnosis not present

## 2019-10-16 DIAGNOSIS — E782 Mixed hyperlipidemia: Secondary | ICD-10-CM | POA: Diagnosis not present

## 2019-10-16 DIAGNOSIS — R7301 Impaired fasting glucose: Secondary | ICD-10-CM

## 2019-10-16 DIAGNOSIS — E039 Hypothyroidism, unspecified: Secondary | ICD-10-CM

## 2019-10-16 DIAGNOSIS — I4821 Permanent atrial fibrillation: Secondary | ICD-10-CM

## 2019-10-16 DIAGNOSIS — I480 Paroxysmal atrial fibrillation: Secondary | ICD-10-CM

## 2019-10-16 NOTE — Assessment & Plan Note (Signed)
Checking TSH and adjust synthroid 50 mcg daily as needed. 

## 2019-10-16 NOTE — Assessment & Plan Note (Signed)
Rate controlled on coreg. Taking warfarin and seeing cardiology's coumadin clinic for dosing. Checking CMP and CBC and adjust as needed.

## 2019-10-16 NOTE — Assessment & Plan Note (Signed)
Checking lipid panel and adjust lipitor 40 mg daily as needed. 

## 2019-10-16 NOTE — Assessment & Plan Note (Signed)
Flu shot yearly. Covid-19 up to date. Pneumonia complete. Shingrix counseled. Tetanus due 2031. Colonoscopy aged out prior to recall. Counseled about sun safety and mole surveillance. Counseled about the dangers of distracted driving. Given 10 year screening recommendations.

## 2019-10-16 NOTE — Telephone Encounter (Signed)
Refill request

## 2019-10-16 NOTE — Assessment & Plan Note (Signed)
BP at goal on coreg. BP records from patient also at goal. Checking CMP and adjust as needed.

## 2019-10-16 NOTE — Patient Instructions (Signed)

## 2019-10-16 NOTE — Progress Notes (Signed)
Subjective:   Patient ID: [name] [name], male    DOB: October 20, 1940, 79 y.o.   MRN: [id number]  HPI Here for medicare wellness, no new complaints. Please see A/P for status and treatment of chronic medical problems.   HPI #2: Here for follow up blood pressure (taking coreg, denies side effects, denies chest pains or headaches) and thyroid (taking synthroid, denies heat or cold intolerance, denies missing doses or side effects) and A fib (taking coreg and warfarin, denies blood in stool or bleeding).   Diet: heart healthy Physical activity: sedentary Depression/mood screen: negative Hearing: intact to whispered voice Visual acuity: grossly normal with lens, performs annual eye exam  ADLs: capable Fall risk: none Home safety: good Cognitive evaluation: intact to orientation, naming, recall and repetition EOL planning: adv directives discussed    Office Visit from 10/16/2019 in [place] at [facility] Total Score 0      I have personally reviewed and have noted 1. The patient's medical and social history - reviewed today no changes 2. Their use of alcohol, tobacco or illicit drugs 3. Their current medications and supplements 4. The patient's functional ability including ADL's, fall risks, home safety risks and hearing or visual impairment. 5. Diet and physical activities 6. Evidence for depression or mood disorders 7. Care team reviewed and updated  Patient Care Team: Hoyt Koch, MD as PCP - General (Internal Medicine) Larey Dresser, MD (Cardiology) Heath Lark, MD (Hematology and Oncology) Izora Gala, MD (Otolaryngology) Past Medical History:  Diagnosis Date  . (HFpEF) heart failure with preserved ejection fraction ([place])   . Atrial fibrillation (HCC)    Paroxysmal, chronic anticoag  . BCC (basal cell carcinoma), lip 09/2013  . CAD (coronary artery disease) 2005   PCI ([place])  . HLD (hyperlipidemia)   . HTN (hypertension)   .  Hypothyroidism   . Kidney stones   . Myocardial infarction, nontransmural, subendocardial 2005  . Psoriasis   . Seizures ([place])    x1 in the 1970s, no AEDs since mid 1970s   Past Surgical History:  Procedure Laterality Date  . BASAL CELL CARCINOMA EXCISION  09/2013  . CARDIAC CATHETERIZATION  2005   RCA: 99% mid. RPL: 70% ostial,   . CORONARY ANGIOPLASTY WITH STENT PLACEMENT  2005   RCA and RPL ([facility])  . HERNIA REPAIR  2008  . [place]  . LIPOMA EXCISION Right 09/21/2013   Procedure: LIP BIOPSY WITH FROZEN SECTION AND WEDGE EXCISION OF RIGHT UPPER LIP ;  Surgeon: Izora Gala, MD;  Location: [facility];  Service: ENT;  Laterality: Right;  . SKIN CANCER EXCISION  2009   right upper lip/nares  . TIBIA FRACTURE SURGERY  1982   right   Family History  Problem Relation Age of Onset  . Atrial fibrillation Mother   . Heart attack Father   . Hypertension Father   . Heart failure Other   . Atrial fibrillation Sister        s/p RFA  . Atrial fibrillation Brother        s/p RFA    Review of Systems  Constitutional: Negative.   HENT: Negative.   Eyes: Negative.   Respiratory: Negative for cough, chest tightness and shortness of breath.   Cardiovascular: Negative for chest pain, palpitations and leg swelling.  Gastrointestinal: Negative for abdominal distention, abdominal pain, constipation, diarrhea, nausea and vomiting.  Musculoskeletal: Negative.   Skin: Negative.   Neurological: Negative.   Psychiatric/Behavioral: Negative.  Objective:  Physical Exam Constitutional:      Appearance: He is well-developed.  HENT:     Head: Normocephalic and atraumatic.  Cardiovascular:     Rate and Rhythm: Normal rate. Rhythm irregular.  Pulmonary:     Effort: Pulmonary effort is normal. No respiratory distress.     Breath sounds: Normal breath sounds. No wheezing or rales.  Abdominal:     General: Bowel sounds are normal. There is no distension.     Palpations: Abdomen is  soft.     Tenderness: There is no abdominal tenderness. There is no rebound.  Musculoskeletal:     Cervical back: Normal range of motion.  Skin:    General: Skin is warm and dry.  Neurological:     Mental Status: He is alert and oriented to person, place, and time.     Coordination: Coordination normal.     Vitals:   10/16/19 0819  BP: 118/78  Pulse: 69  Temp: 98 F (36.7 C)  TempSrc: Oral  SpO2: 99%  Weight: (!) 223 lb (101.2 kg)  Height: 5\' 7"  (1.702 m)    This visit occurred during the SARS-CoV-2 public health emergency.  Safety protocols were in place, including screening questions prior to the visit, additional usage of staff PPE, and extensive cleaning of exam room while observing appropriate contact time as indicated for disinfecting solutions.   Assessment & Plan:

## 2019-10-17 LAB — COMPREHENSIVE METABOLIC PANEL
AG Ratio: 1.9 (calc) (ref 1.0–2.5)
ALT: 19 U/L (ref 9–46)
AST: 25 U/L (ref 10–35)
Albumin: 4.3 g/dL (ref 3.6–5.1)
Alkaline phosphatase (APISO): 71 U/L (ref 35–144)
BUN: 15 mg/dL (ref 7–25)
CO2: 27 mmol/L (ref 20–32)
Calcium: 9.5 mg/dL (ref 8.6–10.3)
Chloride: 107 mmol/L (ref 98–110)
Creat: 0.95 mg/dL (ref 0.70–1.18)
Globulin: 2.3 g/dL (calc) (ref 1.9–3.7)
Glucose, Bld: 107 mg/dL — ABNORMAL HIGH (ref 65–99)
Potassium: 4.6 mmol/L (ref 3.5–5.3)
Sodium: 140 mmol/L (ref 135–146)
Total Bilirubin: 1.8 mg/dL — ABNORMAL HIGH (ref 0.2–1.2)
Total Protein: 6.6 g/dL (ref 6.1–8.1)

## 2019-10-17 LAB — LIPID PANEL
Cholesterol: 127 mg/dL (ref <200)
HDL: 44 mg/dL (ref >=40)
LDL Cholesterol (Calc): 68 mg/dL (calc)
Non-HDL Cholesterol (Calc): 83 mg/dL (calc) (ref <130)
Total CHOL/HDL Ratio: 2.9 (calc) (ref <5.0)
Triglycerides: 74 mg/dL (ref <150)

## 2019-10-17 LAB — CBC
HCT: 42 % (ref 38.5–50.0)
Hemoglobin: 14.2 g/dL (ref 13.2–17.1)
MCH: 33.5 pg — ABNORMAL HIGH (ref 27.0–33.0)
MCHC: 33.8 g/dL (ref 32.0–36.0)
MCV: 99.1 fL (ref 80.0–100.0)
MPV: 10.1 fL (ref 7.5–12.5)
Platelets: 204 10*3/uL (ref 140–400)
RBC: 4.24 10*6/uL (ref 4.20–5.80)
RDW: 14.6 % (ref 11.0–15.0)
WBC: 6.3 10*3/uL (ref 3.8–10.8)

## 2019-10-17 LAB — HEMOGLOBIN A1C
Hgb A1c MFr Bld: 4.7 % of total Hgb (ref <5.7)
Mean Plasma Glucose: 88 (calc)
eAG (mmol/L): 4.9 (calc)

## 2019-10-17 LAB — TSH: TSH: 5.79 mIU/L — ABNORMAL HIGH (ref 0.40–4.50)

## 2019-11-07 ENCOUNTER — Other Ambulatory Visit: Payer: Self-pay

## 2019-11-07 ENCOUNTER — Encounter: Payer: Self-pay | Admitting: Cardiovascular Disease

## 2019-11-07 ENCOUNTER — Ambulatory Visit (INDEPENDENT_AMBULATORY_CARE_PROVIDER_SITE_OTHER): Payer: Medicare Other | Admitting: Cardiovascular Disease

## 2019-11-07 VITALS — BP 188/120 | HR 71 | Ht 67.0 in | Wt 220.8 lb

## 2019-11-07 DIAGNOSIS — I482 Chronic atrial fibrillation, unspecified: Secondary | ICD-10-CM

## 2019-11-07 DIAGNOSIS — I2510 Atherosclerotic heart disease of native coronary artery without angina pectoris: Secondary | ICD-10-CM | POA: Diagnosis not present

## 2019-11-07 DIAGNOSIS — I10 Essential (primary) hypertension: Secondary | ICD-10-CM | POA: Diagnosis not present

## 2019-11-07 DIAGNOSIS — E785 Hyperlipidemia, unspecified: Secondary | ICD-10-CM

## 2019-11-07 MED ORDER — NITROGLYCERIN 0.4 MG SL SUBL: SUBLINGUAL_TABLET | SUBLINGUAL | 1 refills | Status: DC

## 2019-11-07 NOTE — Patient Instructions (Signed)

## 2019-11-07 NOTE — Progress Notes (Signed)
Cardiology Office Note   Date:  11/07/2019   ID:  [name] [name], DOB October 29, 1940, MRN [id number]  PCP:  Hoyt Koch, MD  Cardiologist:   Kathlyn Sacramento, MD   No chief complaint on file.     History of Present Illness: [name] [name] is a 79 y.o. male who presents for a follow up visit regarding CAD and chronic atrial fibrillation.   He has known history of coronary artery disease status post PCI in 2005 and chronic atrial fibrillation. He is on long-term anticoagulation with warfarin with no reported side effects.   He has been doing well with no recent chest pain, shortness of breath or palpitations.  He does report elevated blood pressure when he goes to see a physician.  He reports that his mom also had whitecoat syndrome.  Past Medical History:  Diagnosis Date  . (HFpEF) heart failure with preserved ejection fraction ([place])   . Atrial fibrillation (HCC)    Paroxysmal, chronic anticoag  . BCC (basal cell carcinoma), lip 09/2013  . CAD (coronary artery disease) 2005   PCI ([place])  . HLD (hyperlipidemia)   . HTN (hypertension)   . Hypothyroidism   . Kidney stones   . Myocardial infarction, nontransmural, subendocardial 2005  . Psoriasis   . Seizures ([place])    x1 in the 1970s, no AEDs since mid 1970s    Past Surgical History:  Procedure Laterality Date  . BASAL CELL CARCINOMA EXCISION  09/2013  . CARDIAC CATHETERIZATION  2005   RCA: 99% mid. RPL: 70% ostial,   . CORONARY ANGIOPLASTY WITH STENT PLACEMENT  2005   RCA and RPL ([facility])  . HERNIA REPAIR  2008  . [place]  . LIPOMA EXCISION Right 09/21/2013   Procedure: LIP BIOPSY WITH FROZEN SECTION AND WEDGE EXCISION OF RIGHT UPPER LIP ;  Surgeon: Izora Gala, MD;  Location: [facility];  Service: ENT;  Laterality: Right;  . SKIN CANCER EXCISION  2009   right upper lip/nares  . TIBIA FRACTURE SURGERY  1982   right     Current Outpatient Medications  Medication Sig Dispense Refill  .  atorvastatin (LIPITOR) 40 MG tablet TAKE ONE TABLET BY MOUTH ONE TIME DAILY 90 tablet 3  . carvedilol (COREG) 12.5 MG tablet TAKE 1 TABLET BY MOUTH TWICE DAILY WITH MEALS 180 tablet 3  . Glucosamine-Chondroit-Vit C-Mn (GLUCOSAMINE 1500 COMPLEX PO) Take 1 tablet by mouth 2 (two) times daily.     Marland Kitchen levothyroxine (SYNTHROID) 50 MCG tablet Take 1 tablet by mouth once daily 90 tablet 0  . nitroGLYCERIN (NITROSTAT) 0.4 MG SL tablet PLACE ONE TABLET UNDER THE TONGUE EVERY 5 MINUTES AS NEEDED FOR CHEST PAIN 25 tablet 0  . warfarin (COUMADIN) 5 MG tablet TAKE AS DIRECTED BY COUMADIN CLINIC 120 tablet 1   No current facility-administered medications for this visit.    Allergies:   Patient has no known allergies.    Social History:  The patient  reports that he quit smoking about 26 years ago. He has a 9.00 pack-year smoking history. He has never used smokeless tobacco. He reports current alcohol use of about 2.0 standard drinks of alcohol per week. He reports that he does not use drugs.   Family History:  The patient's family history includes Atrial fibrillation in his brother, mother, and sister; Heart attack in his father; Heart failure in an other family member; Hypertension in his father.    ROS:  Please see the history of  present illness.   Otherwise, review of systems are positive for none.   All other systems are reviewed and negative.    PHYSICAL EXAM: VS:  BP (!) 188/120   Pulse 71   Ht 5\' 7"  (1.702 m)   Wt 220 lb 12.8 oz (100.2 kg)   BMI 34.58 kg/m  , BMI Body mass index is 34.58 kg/m. GEN: Well nourished, well developed, in no acute distress  HEENT: normal  Neck: no JVD, carotid bruits, or masses Cardiac: RRR; no murmurs, rubs, or gallops,no edema  Respiratory:  clear to auscultation bilaterally, normal work of breathing GI: soft, nontender, nondistended, + BS MS: no deformity or atrophy  Skin: warm and dry, no rash Neuro:  Strength and sensation are intact Psych: euthymic  mood, full affect   EKG:  EKG is ordered today. The ekg ordered today demonstrates atrial fibrillation with ventricular rate of 71 bpm.  Poor R wave progression in the anterior leads which is not new.  Recent Labs: 10/16/2019: ALT 19; BUN 15; Creat 0.95; Hemoglobin 14.2; Platelets 204; Potassium 4.6; Sodium 140; TSH 5.79    Lipid Panel    Component Value Date/Time   CHOL 127 10/16/2019 0844   CHOL 119 12/06/2017 0818   TRIG 74 10/16/2019 0844   HDL 44 10/16/2019 0844   HDL 44 12/06/2017 0818   CHOLHDL 2.9 10/16/2019 0844   VLDL 17.0 10/11/2018 0825   LDLCALC 68 10/16/2019 0844      Wt Readings from Last 3 Encounters:  11/07/19 220 lb 12.8 oz (100.2 kg)  10/16/19 (!) 223 lb (101.2 kg)  11/01/18 225 lb (102.1 kg)      No flowsheet data found.    ASSESSMENT AND PLAN:  1.  Chronic atrial fibrillation: He likely transition to chronic atrial fibrillation.  He continues to be asymptomatic.  Continue rate control with carvedilol.  Continue long-term anticoagulation with warfarin with a target INR between 2 and 3.  Warfarin is managed by our anticoagulation clinic.  2. Coronary artery disease involving native coronary arteries without angina: Continue medical therapy.  3. Hyperlipidemia: Continue atorvastatin.  I reviewed his recent labs which showed an LDL of 68.  4.  Essential hypertension: His blood pressure is elevated today.  However, I reviewed his home blood pressure readings and they were all in the normal range and if anything somewhat on the low side with systolic blood pressure around 110.  He likely has whitecoat syndrome.  Continue to monitor for now.   Disposition:   FU with me in 1 year  Signed,  Kathlyn Sacramento, MD  11/07/2019 8:10 AM    [place]

## 2019-11-09 ENCOUNTER — Other Ambulatory Visit: Payer: Self-pay

## 2019-11-09 ENCOUNTER — Ambulatory Visit (INDEPENDENT_AMBULATORY_CARE_PROVIDER_SITE_OTHER): Payer: Medicare Other

## 2019-11-09 DIAGNOSIS — Z5181 Encounter for therapeutic drug level monitoring: Secondary | ICD-10-CM | POA: Diagnosis not present

## 2019-11-09 DIAGNOSIS — I480 Paroxysmal atrial fibrillation: Secondary | ICD-10-CM | POA: Diagnosis not present

## 2019-11-09 LAB — POCT INR: INR: 2.6 (ref 2.0–3.0)

## 2019-11-09 NOTE — Patient Instructions (Signed)
Description   Continue on same dosage 1 tablet every day except 1.5 tablets on Mondays and Wednesdays.  Recheck INR in 8 weeks.  [facility] [phone].     

## 2019-11-10 ENCOUNTER — Telehealth: Payer: Self-pay | Admitting: Internal Medicine

## 2019-11-10 NOTE — Progress Notes (Signed)
  Chronic Care Management   Outreach Note  11/10/2019 Name: [name] [name] MRN: [id number] DOB: Dec 21, 1940  Referred by: Hoyt Koch, MD Reason for referral : No chief complaint on file.   An unsuccessful telephone outreach was attempted today. The patient was referred to the pharmacist for assistance with care management and care coordination.   Follow Up Plan:   [name] Upstream Scheduler

## 2019-12-11 ENCOUNTER — Encounter: Payer: Self-pay | Admitting: Internal Medicine

## 2019-12-23 DIAGNOSIS — Z23 Encounter for immunization: Secondary | ICD-10-CM | POA: Diagnosis not present

## 2019-12-27 ENCOUNTER — Other Ambulatory Visit: Payer: Self-pay | Admitting: Internal Medicine

## 2020-01-04 ENCOUNTER — Other Ambulatory Visit: Payer: Self-pay

## 2020-01-04 ENCOUNTER — Ambulatory Visit (INDEPENDENT_AMBULATORY_CARE_PROVIDER_SITE_OTHER): Payer: Medicare Other

## 2020-01-04 DIAGNOSIS — I480 Paroxysmal atrial fibrillation: Secondary | ICD-10-CM | POA: Diagnosis not present

## 2020-01-04 DIAGNOSIS — Z5181 Encounter for therapeutic drug level monitoring: Secondary | ICD-10-CM | POA: Diagnosis not present

## 2020-01-04 LAB — POCT INR: INR: 2.8 (ref 2.0–3.0)

## 2020-01-04 NOTE — Patient Instructions (Signed)
Description   Continue on same dosage 1 tablet every day except 1.5 tablets on Mondays and Wednesdays.  Recheck INR in 8 weeks.  Coumadin Clinic [phone].

## 2020-01-29 DIAGNOSIS — Z23 Encounter for immunization: Secondary | ICD-10-CM | POA: Diagnosis not present

## 2020-02-29 ENCOUNTER — Other Ambulatory Visit: Payer: Self-pay

## 2020-02-29 ENCOUNTER — Ambulatory Visit (INDEPENDENT_AMBULATORY_CARE_PROVIDER_SITE_OTHER): Payer: Medicare Other | Admitting: *Deleted

## 2020-02-29 DIAGNOSIS — I480 Paroxysmal atrial fibrillation: Secondary | ICD-10-CM | POA: Diagnosis not present

## 2020-02-29 DIAGNOSIS — Z5181 Encounter for therapeutic drug level monitoring: Secondary | ICD-10-CM

## 2020-02-29 LAB — POCT INR: INR: 2.8 (ref 2.0–3.0)

## 2020-02-29 NOTE — Patient Instructions (Signed)
Description   ?Continue taking Warfarin 1 tablet every day except 1.5 tablets on Mondays and Wednesdays. Recheck INR in 8 weeks. Coumadin Clinic [phone]. ?  ?  ?

## 2020-03-25 ENCOUNTER — Other Ambulatory Visit: Payer: Self-pay | Admitting: Internal Medicine

## 2020-04-25 ENCOUNTER — Other Ambulatory Visit: Payer: Self-pay

## 2020-04-25 ENCOUNTER — Ambulatory Visit (INDEPENDENT_AMBULATORY_CARE_PROVIDER_SITE_OTHER): Payer: Medicare Other | Admitting: *Deleted

## 2020-04-25 DIAGNOSIS — Z5181 Encounter for therapeutic drug level monitoring: Secondary | ICD-10-CM | POA: Diagnosis not present

## 2020-04-25 DIAGNOSIS — I480 Paroxysmal atrial fibrillation: Secondary | ICD-10-CM | POA: Diagnosis not present

## 2020-04-25 LAB — POCT INR: INR: 2.6 (ref 2.0–3.0)

## 2020-04-25 NOTE — Patient Instructions (Signed)
Description   ?Continue taking Warfarin 1 tablet every day except 1.5 tablets on Mondays and Wednesdays. Recheck INR in 8 weeks. Coumadin Clinic [phone]. ?  ?  ?

## 2020-05-07 ENCOUNTER — Other Ambulatory Visit: Payer: Self-pay | Admitting: Cardiology

## 2020-06-20 ENCOUNTER — Other Ambulatory Visit: Payer: Self-pay

## 2020-06-20 ENCOUNTER — Ambulatory Visit (INDEPENDENT_AMBULATORY_CARE_PROVIDER_SITE_OTHER): Payer: Medicare Other

## 2020-06-20 DIAGNOSIS — I480 Paroxysmal atrial fibrillation: Secondary | ICD-10-CM

## 2020-06-20 DIAGNOSIS — Z5181 Encounter for therapeutic drug level monitoring: Secondary | ICD-10-CM

## 2020-06-20 LAB — POCT INR: INR: 3.4 — AB (ref 2.0–3.0)

## 2020-06-20 NOTE — Patient Instructions (Signed)
Description   Skip today's dosage of Warfarin, then resume same dosage  1 tablet every day except 1.5 tablets on Mondays and Wednesdays. Recheck INR in 4 weeks (usually 8 weeks). Coumadin Clinic [phone].

## 2020-06-24 ENCOUNTER — Other Ambulatory Visit: Payer: Self-pay | Admitting: Internal Medicine

## 2020-06-26 NOTE — Telephone Encounter (Signed)
Patient calling inquiring about the refill, patient also wondering if he can get a year supply

## 2020-07-18 ENCOUNTER — Ambulatory Visit (INDEPENDENT_AMBULATORY_CARE_PROVIDER_SITE_OTHER): Payer: Medicare Other | Admitting: *Deleted

## 2020-07-18 ENCOUNTER — Other Ambulatory Visit: Payer: Self-pay

## 2020-07-18 DIAGNOSIS — I480 Paroxysmal atrial fibrillation: Secondary | ICD-10-CM | POA: Diagnosis not present

## 2020-07-18 DIAGNOSIS — Z5181 Encounter for therapeutic drug level monitoring: Secondary | ICD-10-CM

## 2020-07-18 LAB — POCT INR: INR: 2.9 (ref 2.0–3.0)

## 2020-07-18 NOTE — Patient Instructions (Signed)
Description   Continue taking 1 tablet every day except 1.5 tablets on Mondays and Wednesdays. Add another leafy veggie to your diet and remain consistent. Recheck INR in 4 weeks (usually 8 weeks). Coumadin Clinic [phone].

## 2020-07-25 DIAGNOSIS — Z23 Encounter for immunization: Secondary | ICD-10-CM | POA: Diagnosis not present

## 2020-08-08 ENCOUNTER — Telehealth: Payer: Self-pay | Admitting: Internal Medicine

## 2020-08-08 NOTE — Telephone Encounter (Signed)
Patient is returning call not sure why a pharmacist reached out to him Call line only rang once, voicemail was not clear  Patient wants to understand why he is being referred to a pharmacist  Please call the patient at [phone]

## 2020-08-08 NOTE — Progress Notes (Signed)
  Chronic Care Management   Outreach Note  08/08/2020 Name: Lukasz Rogus MRN: [id number] DOB: Aug 06, 1940  Referred by: Hoyt Koch, MD Reason for referral : No chief complaint on file.   An unsuccessful telephone outreach was attempted today. The patient was referred to the pharmacist for assistance with care management and care coordination.   Follow Up Plan:   Lauretta Grill Upstream Scheduler

## 2020-08-09 NOTE — Telephone Encounter (Signed)
See below. Patient doesn't understand why he needs to follow up with a pharmacist.

## 2020-08-09 NOTE — Telephone Encounter (Signed)
See below

## 2020-08-09 NOTE — Telephone Encounter (Signed)
This is the CCM team, likely someone in the office can explain to him what this is so he can decide if he wants to do this. I'm not sure why this would not be sent to the CCM team?

## 2020-08-13 ENCOUNTER — Telehealth: Payer: Self-pay | Admitting: Internal Medicine

## 2020-08-13 NOTE — Chronic Care Management (AMB) (Signed)
  Chronic Care Management   Note  08/13/2020 Name: Rashawn Rayman MRN: [id number] DOB: 1940/03/19  Dene Nazir is a 80 y.o. year old male who is a primary care patient of Hoyt Koch, MD. I reached out to [employer] by phone today in response to a referral sent by Mr. Esaias Woo's PCP, Hoyt Koch, MD.   Mr. Beaumier was given information about Chronic Care Management services today including:  1. CCM service includes personalized support from designated clinical staff supervised by his physician, including individualized plan of care and coordination with other care providers 2. 24/7 contact phone numbers for assistance for urgent and routine care needs. 3. Service will only be billed when office clinical staff spend 20 minutes or more in a month to coordinate care. 4. Only one practitioner may furnish and bill the service in a calendar month. 5. The patient may stop CCM services at any time (effective at the end of the month) by phone call to the office staff.   Patient did not agree to enrollment in care management services and does not wish to consider at this time.  Follow up plan:   Lauretta Grill Upstream Scheduler

## 2020-08-13 NOTE — Progress Notes (Signed)
  Chronic Care Management   Outreach Note  08/13/2020 Name: Ardis Fullwood MRN: [id number] DOB: 03-10-1941  Referred by: Hoyt Koch, MD Reason for referral : No chief complaint on file.   A second unsuccessful telephone outreach was attempted today. The patient was referred to pharmacist for assistance with care management and care coordination.  Follow Up Plan:   Lauretta Grill Upstream Scheduler

## 2020-08-15 ENCOUNTER — Other Ambulatory Visit: Payer: Self-pay

## 2020-08-15 ENCOUNTER — Ambulatory Visit (INDEPENDENT_AMBULATORY_CARE_PROVIDER_SITE_OTHER): Payer: Medicare Other | Admitting: *Deleted

## 2020-08-15 DIAGNOSIS — Z5181 Encounter for therapeutic drug level monitoring: Secondary | ICD-10-CM | POA: Diagnosis not present

## 2020-08-15 DIAGNOSIS — I480 Paroxysmal atrial fibrillation: Secondary | ICD-10-CM

## 2020-08-15 LAB — POCT INR: INR: 3 (ref 2.0–3.0)

## 2020-08-15 NOTE — Patient Instructions (Signed)
Description   Continue taking 1 tablet every day except 1.5 tablets on Mondays and Wednesdays. Add another leafy veggie to your diet and remain consistent. Recheck INR in 5 weeks. Coumadin Clinic [phone].

## 2020-09-19 ENCOUNTER — Other Ambulatory Visit: Payer: Self-pay

## 2020-09-19 ENCOUNTER — Ambulatory Visit (INDEPENDENT_AMBULATORY_CARE_PROVIDER_SITE_OTHER): Payer: Medicare Other | Admitting: *Deleted

## 2020-09-19 ENCOUNTER — Other Ambulatory Visit: Payer: Self-pay | Admitting: Cardiovascular Disease

## 2020-09-19 DIAGNOSIS — I480 Paroxysmal atrial fibrillation: Secondary | ICD-10-CM | POA: Diagnosis not present

## 2020-09-19 DIAGNOSIS — Z5181 Encounter for therapeutic drug level monitoring: Secondary | ICD-10-CM

## 2020-09-19 LAB — POCT INR: INR: 2 (ref 2.0–3.0)

## 2020-09-19 NOTE — Patient Instructions (Signed)
Description   Continue taking 1 tablet every day except 1.5 tablets on Mondays and Wednesdays. Add another leafy veggie to your diet and remain consistent. Recheck INR in 6 weeks. Coumadin Clinic [phone].

## 2020-09-19 NOTE — Telephone Encounter (Signed)
Refill request

## 2020-09-23 ENCOUNTER — Other Ambulatory Visit: Payer: Self-pay | Admitting: Cardiovascular Disease

## 2020-09-23 ENCOUNTER — Other Ambulatory Visit: Payer: Self-pay | Admitting: Internal Medicine

## 2020-09-23 DIAGNOSIS — I480 Paroxysmal atrial fibrillation: Secondary | ICD-10-CM

## 2020-10-15 ENCOUNTER — Encounter: Payer: Self-pay | Admitting: Internal Medicine

## 2020-10-15 ENCOUNTER — Other Ambulatory Visit: Payer: Self-pay

## 2020-10-15 ENCOUNTER — Ambulatory Visit (INDEPENDENT_AMBULATORY_CARE_PROVIDER_SITE_OTHER): Payer: Medicare Other | Admitting: Internal Medicine

## 2020-10-15 VITALS — BP 122/68 | HR 96 | Temp 98.5°F | Resp 18 | Ht 67.0 in | Wt 200.4 lb

## 2020-10-15 DIAGNOSIS — R300 Dysuria: Secondary | ICD-10-CM | POA: Insufficient documentation

## 2020-10-15 LAB — POCT URINALYSIS DIPSTICK
Bilirubin, UA: NEGATIVE
Glucose, UA: NEGATIVE
Leukocytes, UA: NEGATIVE
Nitrite, UA: NEGATIVE
Protein, UA: POSITIVE — AB
Spec Grav, UA: 1.025 (ref 1.010–1.025)
Urobilinogen, UA: 0.2 E.U./dL
pH, UA: 5.5 (ref 5.0–8.0)

## 2020-10-15 MED ORDER — CEPHALEXIN 500 MG PO CAPS: 500.0000 mg | ORAL_CAPSULE | Freq: Two times a day (BID) | ORAL | 0 refills | Status: AC

## 2020-10-15 NOTE — Assessment & Plan Note (Signed)
Rx 3 day keflex. Mild burning and suspect cystitis. No concern per patient for STI.

## 2020-10-15 NOTE — Progress Notes (Signed)
   Subjective:   Patient ID: [name] [name], male    DOB: October 14, 1940, 80 y.o.   MRN: [id number]  HPI The patient is a 80 YO man coming in for possible UTI. Started Saturday with burning and has gone in since then. Denies fevers or chills. Remote history of kidney stones but does not feel similar.   Review of Systems  Constitutional: Negative.   HENT: Negative.    Eyes: Negative.   Respiratory:  Negative for cough, chest tightness and shortness of breath.   Cardiovascular:  Negative for chest pain, palpitations and leg swelling.  Gastrointestinal:  Negative for abdominal distention, abdominal pain, constipation, diarrhea, nausea and vomiting.  Genitourinary:  Positive for dysuria.  Musculoskeletal: Negative.   Skin: Negative.   Neurological: Negative.   Psychiatric/Behavioral: Negative.     Objective:  Physical Exam Constitutional:      Appearance: He is well-developed.  HENT:     Head: Normocephalic and atraumatic.  Cardiovascular:     Rate and Rhythm: Normal rate and regular rhythm.  Pulmonary:     Effort: Pulmonary effort is normal. No respiratory distress.     Breath sounds: Normal breath sounds. No wheezing or rales.  Abdominal:     General: Bowel sounds are normal. There is no distension.     Palpations: Abdomen is soft.     Tenderness: There is no abdominal tenderness. There is no rebound.  Musculoskeletal:     Cervical back: Normal range of motion.  Skin:    General: Skin is warm and dry.  Neurological:     Mental Status: He is alert and oriented to person, place, and time.     Coordination: Coordination normal.    Vitals:   10/15/20 1309  BP: 122/68  Pulse: 96  Resp: 18  Temp: 98.5 F (36.9 C)  TempSrc: Oral  SpO2: 95%  Weight: 200 lb 6.4 oz (90.9 kg)  Height: '5\' 7"'$  (1.702 m)    This visit occurred during the SARS-CoV-2 public health emergency.  Safety protocols were in place, including screening questions prior to the visit, additional usage of staff  PPE, and extensive cleaning of exam room while observing appropriate contact time as indicated for disinfecting solutions.   Assessment & Plan:

## 2020-10-15 NOTE — Patient Instructions (Addendum)
We will have you take 3 days of antibiotics.

## 2020-10-21 ENCOUNTER — Encounter: Payer: Self-pay | Admitting: Internal Medicine

## 2020-10-21 ENCOUNTER — Ambulatory Visit (INDEPENDENT_AMBULATORY_CARE_PROVIDER_SITE_OTHER): Payer: Medicare Other | Admitting: Internal Medicine

## 2020-10-21 ENCOUNTER — Other Ambulatory Visit: Payer: Self-pay

## 2020-10-21 VITALS — BP 118/68 | HR 84 | Temp 98.0°F | Resp 18 | Ht 67.0 in | Wt 197.8 lb

## 2020-10-21 DIAGNOSIS — I4821 Permanent atrial fibrillation: Secondary | ICD-10-CM

## 2020-10-21 DIAGNOSIS — I10 Essential (primary) hypertension: Secondary | ICD-10-CM

## 2020-10-21 DIAGNOSIS — Z0000 Encounter for general adult medical examination without abnormal findings: Secondary | ICD-10-CM | POA: Diagnosis not present

## 2020-10-21 DIAGNOSIS — E039 Hypothyroidism, unspecified: Secondary | ICD-10-CM | POA: Diagnosis not present

## 2020-10-21 DIAGNOSIS — E782 Mixed hyperlipidemia: Secondary | ICD-10-CM | POA: Diagnosis not present

## 2020-10-21 LAB — LIPID PANEL
Cholesterol: 106 mg/dL (ref 0–200)
HDL: 35.2 mg/dL — ABNORMAL LOW (ref >39.00)
LDL Cholesterol: 57 mg/dL (ref 0–99)
NonHDL: 71.11
Total CHOL/HDL Ratio: 3
Triglycerides: 73 mg/dL (ref 0.0–149.0)
VLDL: 14.6 mg/dL (ref 0.0–40.0)

## 2020-10-21 LAB — COMPREHENSIVE METABOLIC PANEL
ALT: 27 U/L (ref 0–53)
AST: 25 U/L (ref 0–37)
Albumin: 4.1 g/dL (ref 3.5–5.2)
Alkaline Phosphatase: 67 U/L (ref 39–117)
BUN: 20 mg/dL (ref 6–23)
CO2: 27 mEq/L (ref 19–32)
Calcium: 9 mg/dL (ref 8.4–10.5)
Chloride: 106 mEq/L (ref 96–112)
Creatinine, Ser: 0.87 mg/dL (ref 0.40–1.50)
GFR: 83.01 mL/min (ref >60.00)
Glucose, Bld: 111 mg/dL — ABNORMAL HIGH (ref 70–99)
Potassium: 4.1 mEq/L (ref 3.5–5.1)
Sodium: 139 mEq/L (ref 135–145)
Total Bilirubin: 2.1 mg/dL — ABNORMAL HIGH (ref 0.2–1.2)
Total Protein: 6.5 g/dL (ref 6.0–8.3)

## 2020-10-21 LAB — CBC
HCT: 37 % — ABNORMAL LOW (ref 39.0–52.0)
Hemoglobin: 12.8 g/dL — ABNORMAL LOW (ref 13.0–17.0)
MCHC: 34.7 g/dL (ref 30.0–36.0)
MCV: 96.4 fl (ref 78.0–100.0)
Platelets: 259 10*3/uL (ref 150.0–400.0)
RBC: 3.84 Mil/uL — ABNORMAL LOW (ref 4.22–5.81)
RDW: 16 % — ABNORMAL HIGH (ref 11.5–15.5)
WBC: 6.1 10*3/uL (ref 4.0–10.5)

## 2020-10-21 LAB — TSH: TSH: 3.63 u[IU]/mL (ref 0.35–5.50)

## 2020-10-21 NOTE — Progress Notes (Signed)
Subjective:   Patient ID: [name] [name], male    DOB: [birth year], 80 y.o.   MRN: [id number]  HPI Here for medicare wellness, no new complaints. Please see A/P for status and treatment of chronic medical problems.   HPI #2: Here for follow up chronic conditions see A/P for details including thyroid, cholesterol, blood pressure.   Diet: heart healthy  Physical activity: sedentary, walks regular Depression/mood screen: negative Hearing: intact to whispered voice Visual acuity: grossly normal with lens, performs annual eye exam  ADLs: capable Fall risk: none Home safety: good Cognitive evaluation: intact to orientation, naming, recall and repetition EOL planning: adv directives discussed  [place] Visit from 10/15/2020 in [place] at [facility] Total Score 0        Fall Risk  10/15/2020 02/06/2019 10/11/2018 07/08/2017 10/05/2016  Falls in the past year? 0 0 0 No No  Comment - Emmi Telephone Survey: data to providers prior to load - - Emmi Telephone Survey: data to providers prior to load  Number falls in past yr: 0 - - - -  Injury with Fall? 0 - - - -    I have personally reviewed and have noted 1. The patient's medical and social history - reviewed today no changes 2. Their use of alcohol, tobacco or illicit drugs 3. Their current medications and supplements 4. The patient's functional ability including ADL's, fall risks, home safety risks and hearing or visual impairment. 5. Diet and physical activities 6. Evidence for depression or mood disorders 7. Care team reviewed and updated 8.  The patient is not on an opioid pain medication.  Patient Care Team: Hoyt Koch, MD as PCP - General (Internal Medicine) Larey Dresser, MD (Cardiology) Heath Lark, MD (Hematology and Oncology) Izora Gala, MD (Otolaryngology) Past Medical History:  Diagnosis Date   (HFpEF) heart failure with preserved ejection fraction [facility])    Atrial  fibrillation ([place])    Paroxysmal, chronic anticoag   BCC (basal cell carcinoma), lip 09/2013   CAD (coronary artery disease) 2005   PCI ([place])   HLD (hyperlipidemia)    HTN (hypertension)    Hypothyroidism    Kidney stones    Myocardial infarction, nontransmural, subendocardial 2005   Psoriasis    Seizures ([place])    x1 in the 1970s, no AEDs since mid 1970s   Past Surgical History:  Procedure Laterality Date   BASAL CELL CARCINOMA EXCISION  09/2013   CARDIAC CATHETERIZATION  2005   RCA: 99% mid. RPL: 70% ostial,    CORONARY ANGIOPLASTY WITH STENT PLACEMENT  2005   RCA and RPL ([facility])   HERNIA REPAIR  2008   KIDNEY STONE SURGERY  1987   LIPOMA EXCISION Right 09/21/2013   Procedure: LIP BIOPSY WITH FROZEN SECTION AND WEDGE EXCISION OF RIGHT UPPER LIP ;  Surgeon: Izora Gala, MD;  Location: [facility];  Service: ENT;  Laterality: Right;   SKIN CANCER EXCISION  2009   right upper lip/nares   TIBIA FRACTURE SURGERY  1982   right   Family History  Problem Relation Age of Onset   Atrial fibrillation Mother    Heart attack Father    Hypertension Father    Heart failure Other    Atrial fibrillation Sister        s/p RFA   Atrial fibrillation Brother        s/p RFA   Review of Systems  Constitutional: Negative.   HENT: Negative.    Eyes:  Negative.   Respiratory:  Negative for cough, chest tightness and shortness of breath.   Cardiovascular:  Negative for chest pain, palpitations and leg swelling.  Gastrointestinal:  Negative for abdominal distention, abdominal pain, constipation, diarrhea, nausea and vomiting.  Musculoskeletal: Negative.   Skin: Negative.   Neurological: Negative.   Psychiatric/Behavioral: Negative.     Objective:  Physical Exam Constitutional:      Appearance: He is well-developed.  HENT:     Head: Normocephalic and atraumatic.  Cardiovascular:     Rate and Rhythm: Normal rate. Rhythm irregular.  Pulmonary:     Effort: Pulmonary effort is normal. No  respiratory distress.     Breath sounds: Normal breath sounds. No wheezing or rales.  Abdominal:     General: Bowel sounds are normal. There is no distension.     Palpations: Abdomen is soft.     Tenderness: There is no abdominal tenderness. There is no rebound.  Musculoskeletal:     Cervical back: Normal range of motion.  Skin:    General: Skin is warm and dry.  Neurological:     Mental Status: He is alert and oriented to person, place, and time.     Coordination: Coordination normal.    Vitals:   10/21/20 0936  BP: 118/68  Pulse: 84  Resp: 18  Temp: 98 F (36.7 C)  TempSrc: Oral  SpO2: 99%  Weight: 197 lb 12.8 oz (89.7 kg)  Height: '5\' 7"'$  (1.702 m)   This visit occurred during the SARS-CoV-2 public health emergency.  Safety protocols were in place, including screening questions prior to the visit, additional usage of staff PPE, and extensive cleaning of exam room while observing appropriate contact time as indicated for disinfecting solutions.   Assessment & Plan:

## 2020-10-22 NOTE — Assessment & Plan Note (Signed)
Checking lipid panel and adjust lipitor 40 mg daily as needed. 

## 2020-10-22 NOTE — Assessment & Plan Note (Signed)
Checking TSH and adjust synthroid 50 mcg daily as needed. 

## 2020-10-22 NOTE — Assessment & Plan Note (Signed)
BP at goal on coreg 12.5 mg twice a day. Checking CMP and adjust as needed. HR okay.

## 2020-10-22 NOTE — Assessment & Plan Note (Signed)
HR controlled on coreg and warfarin for anticoagulation. No signs of bleeding. Checking CBC and CMP.

## 2020-10-22 NOTE — Assessment & Plan Note (Signed)
Flu shot yearly. Pneumonia complete. Shingrix counseled to get at pharmacy. Tetanus due 2031. Colonoscopy aged out future. Counseled about sun safety and mole surveillance. Counseled about the dangers of distracted driving. Given 10 year screening recommendations.

## 2020-10-25 ENCOUNTER — Encounter: Payer: Self-pay | Admitting: *Deleted

## 2020-10-29 ENCOUNTER — Other Ambulatory Visit: Payer: Self-pay | Admitting: Cardiovascular Disease

## 2020-10-29 NOTE — Telephone Encounter (Signed)
Refill request

## 2020-10-31 ENCOUNTER — Other Ambulatory Visit: Payer: Self-pay

## 2020-10-31 ENCOUNTER — Ambulatory Visit (INDEPENDENT_AMBULATORY_CARE_PROVIDER_SITE_OTHER): Payer: Medicare Other | Admitting: *Deleted

## 2020-10-31 DIAGNOSIS — Z5181 Encounter for therapeutic drug level monitoring: Secondary | ICD-10-CM

## 2020-10-31 DIAGNOSIS — I480 Paroxysmal atrial fibrillation: Secondary | ICD-10-CM | POA: Diagnosis not present

## 2020-10-31 LAB — POCT INR: INR: 2.3 (ref 2.0–3.0)

## 2020-10-31 NOTE — Patient Instructions (Signed)
Description   Continue taking 1 tablet every day except 1.5 tablets on Mondays and Wednesdays. Add another leafy veggie to your diet and remain consistent. Recheck INR in 7 weeks. Coumadin Clinic [phone].

## 2020-11-06 ENCOUNTER — Telehealth: Payer: Self-pay | Admitting: Cardiovascular Disease

## 2020-11-06 NOTE — Telephone Encounter (Signed)
   Pt c/o medication issue:  1. Name of Medication: imodium  2. How are you currently taking this medication (dosage and times per day)?   3. Are you having a reaction (difficulty breathing--STAT)?   4. What is your medication issue? Pt would like to know if he can take imodium with his warfarin, pt asking if he can get a call back as soon as possible

## 2020-11-06 NOTE — Telephone Encounter (Signed)
Returned call to the pt regarding taking imodium with his warfarin; advised that it is safe to take & to follow the directions on the bottle. Advised that if it is diarrhea that it can cause the INR to go up and he states that it's only loose in the morning and has been since taking an antibiotic awhile back. He was thankful for the call back & will call back if any other questions.

## 2020-11-12 ENCOUNTER — Ambulatory Visit: Payer: Medicare Other | Admitting: Cardiovascular Disease

## 2020-11-19 ENCOUNTER — Other Ambulatory Visit: Payer: Self-pay

## 2020-11-19 ENCOUNTER — Ambulatory Visit (INDEPENDENT_AMBULATORY_CARE_PROVIDER_SITE_OTHER): Payer: Medicare Other | Admitting: Cardiovascular Disease

## 2020-11-19 ENCOUNTER — Encounter: Payer: Self-pay | Admitting: Cardiovascular Disease

## 2020-11-19 VITALS — BP 126/82 | HR 64 | Ht 67.0 in | Wt 199.0 lb

## 2020-11-19 DIAGNOSIS — E785 Hyperlipidemia, unspecified: Secondary | ICD-10-CM

## 2020-11-19 DIAGNOSIS — I10 Essential (primary) hypertension: Secondary | ICD-10-CM | POA: Diagnosis not present

## 2020-11-19 DIAGNOSIS — I482 Chronic atrial fibrillation, unspecified: Secondary | ICD-10-CM | POA: Diagnosis not present

## 2020-11-19 DIAGNOSIS — I2510 Atherosclerotic heart disease of native coronary artery without angina pectoris: Secondary | ICD-10-CM | POA: Diagnosis not present

## 2020-11-19 MED ORDER — ATORVASTATIN CALCIUM 40 MG PO TABS: 40.0000 mg | ORAL_TABLET | Freq: Every day | ORAL | 3 refills | Status: DC

## 2020-11-19 MED ORDER — NITROGLYCERIN 0.4 MG SL SUBL: SUBLINGUAL_TABLET | SUBLINGUAL | 3 refills | Status: DC

## 2020-11-19 MED ORDER — CARVEDILOL 12.5 MG PO TABS: 12.5000 mg | ORAL_TABLET | Freq: Two times a day (BID) | ORAL | 3 refills | Status: DC

## 2020-11-19 NOTE — Progress Notes (Signed)
Cardiology Office Note   Date:  11/19/2020   ID:  [name] [name], DOB [birth year], MRN [name] [name]:[id number]  PCP:  [name] Koch, MD  Cardiologist:   [name] Sacramento, MD   Chief Complaint  Patient presents with   Follow-up    12 months.      History of Present Illness: [name] [name] is a 80 y.o. male who presents for a follow up visit regarding CAD and chronic atrial fibrillation.   He has known history of coronary artery disease status post PCI in 2005 and chronic atrial fibrillation. He is on long-term anticoagulation with warfarin with no reported side effects.   He has history of whitecoat syndrome but his blood pressure is well controlled today.  He improved his diet over the last year and managed to lose 30 pounds.  He feels significantly better overall.  He denies chest pain, shortness of breath or palpitations.  He had some issues with diarrhea recently after treatment of UTI.  That seems to be improving.  Past Medical History:  Diagnosis Date   (HFpEF) heart failure with preserved ejection fraction (HCC)    Atrial fibrillation (HCC)    Paroxysmal, chronic anticoag   BCC (basal cell carcinoma), lip 09/2013   CAD (coronary artery disease) 2005   PCI ([place])   HLD (hyperlipidemia)    HTN (hypertension)    Hypothyroidism    Kidney stones    Myocardial infarction, nontransmural, subendocardial 2005   Psoriasis    Seizures ([place])    x1 in the 1970s, no AEDs since mid 1970s    Past Surgical History:  Procedure Laterality Date   BASAL CELL CARCINOMA EXCISION  09/2013   CARDIAC CATHETERIZATION  2005   RCA: 99% mid. RPL: 70% ostial,    CORONARY ANGIOPLASTY WITH STENT PLACEMENT  2005   RCA and RPL ([facility])   HERNIA REPAIR  2008   KIDNEY STONE SURGERY  1987   LIPOMA EXCISION Right 09/21/2013   Procedure: LIP BIOPSY WITH FROZEN SECTION AND WEDGE EXCISION OF RIGHT UPPER LIP ;  Surgeon: Izora Gala, MD;  Location: [facility];  Service: ENT;  Laterality: Right;   SKIN CANCER  EXCISION  2009   right upper lip/nares   TIBIA FRACTURE SURGERY  1982   right     Current Outpatient Medications  Medication Sig Dispense Refill   atorvastatin (LIPITOR) 40 MG tablet Take 1 tablet (40 mg total) by mouth daily. KEEP OV. 90 tablet 0   carvedilol (COREG) 12.5 MG tablet Take 1 tablet (12.5 mg total) by mouth 2 (two) times daily with a meal. PLEASE KEEP SCHEDULED APPOINTMENT IN AUGUST. 180 tablet 0   Glucosamine-Chondroit-Vit C-Mn (GLUCOSAMINE 1500 COMPLEX PO) Take 1 tablet by mouth 2 (two) times daily.      levothyroxine (SYNTHROID) 50 MCG tablet Take 1 tablet by mouth once daily 90 tablet 0   nitroGLYCERIN (NITROSTAT) 0.4 MG SL tablet PLACE ONE TABLET UNDER THE TONGUE EVERY 5 MINUTES AS NEEDED FOR CHEST PAIN 25 tablet 1   warfarin (COUMADIN) 5 MG tablet TAKE AS DIRECTED BY  COUMADIN  CLINIC 70 tablet 3   No current facility-administered medications for this visit.    Allergies:   Patient has no known allergies.    Social History:  The patient  reports that he quit smoking about 27 years ago. He has a 9.00 pack-year smoking history. He has never used smokeless tobacco. He reports current alcohol use of about 2.0 standard drinks per week. He reports that  he does not use drugs.   Family History:  The patient's family history includes Atrial fibrillation in his brother, mother, and sister; Heart attack in his father; Heart failure in an other family member; Hypertension in his father.    ROS:  Please see the history of present illness.   Otherwise, review of systems are positive for none.   All other systems are reviewed and negative.    PHYSICAL EXAM: VS:  BP 126/82 (BP Location: Left Arm, Patient Position: Sitting, Cuff Size: Normal)   Pulse 64   Ht '5\' 7"'$  (1.702 m)   Wt 199 lb (90.3 kg)   BMI 31.17 kg/m  , BMI Body mass index is 31.17 kg/m. GEN: Well nourished, well developed, in no acute distress  HEENT: normal  Neck: no JVD, carotid bruits, or masses Cardiac:  Irregularly irregular; no murmurs, rubs, or gallops,no edema  Respiratory:  clear to auscultation bilaterally, normal work of breathing GI: soft, nontender, nondistended, + BS MS: no deformity or atrophy  Skin: warm and dry, no rash Neuro:  Strength and sensation are intact Psych: euthymic mood, full affect   EKG:  EKG is ordered today. The ekg ordered today demonstrates atrial fibrillation with ventricular rate of 64 bpm.  Poor R wave progression in the anterior leads.  Recent Labs: 10/21/2020: ALT 27; BUN 20; Creatinine, Ser 0.87; Hemoglobin 12.8; Platelets 259.0; Potassium 4.1; Sodium 139; TSH 3.63    Lipid Panel    Component Value Date/Time   CHOL 106 10/21/2020 0957   CHOL 119 12/06/2017 0818   TRIG 73.0 10/21/2020 0957   HDL 35.20 (L) 10/21/2020 0957   HDL 44 12/06/2017 0818   CHOLHDL 3 10/21/2020 0957   VLDL 14.6 10/21/2020 0957   LDLCALC 57 10/21/2020 0957   LDLCALC 68 10/16/2019 0844      Wt Readings from Last 3 Encounters:  11/19/20 199 lb (90.3 kg)  10/21/20 197 lb 12.8 oz (89.7 kg)  10/15/20 200 lb 6.4 oz (90.9 kg)      No flowsheet data found.    ASSESSMENT AND PLAN:  1.  Chronic atrial fibrillation: Ventricular rate is well controlled with carvedilol which was refilled today.   He continues to do well with warfarin anticoagulation with mostly therapeutic INR.  This is managed by our anticoagulation clinic.  I reviewed his recent labs which showed normal renal function and mild anemia.    2. Coronary artery disease involving native coronary arteries without angina: Continue medical therapy.  I refilled his sublingual nitroglycerin.  3. Hyperlipidemia: I reviewed his recent lipid profile which showed an LDL of 57.  His HDL decreased to 35 and I advised him to increase his exercise.  I recommend continuing current dose of atorvastatin 40 mg daily which was refilled today.  4.  Essential hypertension: Blood pressure is well controlled.   Disposition:   FU  with me in 1 year  Signed,  [name] Sacramento, MD  11/19/2020 8:22 AM    [name] [name]

## 2020-11-19 NOTE — Patient Instructions (Signed)
Medication Instructions:  Your physician recommends that you continue on your current medications as directed. Please refer to the Current Medication list given to you today.  *If you need a refill on your cardiac medications before your next appointment, please call your pharmacy*  Follow-Up: At [facility], you and your health needs are our priority.  As part of our continuing mission to provide you with exceptional heart care, we have created designated Provider Care Teams.  These Care Teams include your primary Cardiologist (physician) and Advanced Practice Providers (APPs -  Physician Assistants and Nurse Practitioners) who all work together to provide you with the care you need, when you need it.  We recommend signing up for the patient portal called "MyChart".  Sign up information is provided on this After Visit Summary.  MyChart is used to connect with patients for Virtual Visits (Telemedicine).  Patients are able to view lab/test results, encounter notes, upcoming appointments, etc.  Non-urgent messages can be sent to your provider as well.   To learn more about what you can do with MyChart, go to [email or web address].    Your next appointment:   12 month(s)  The format for your next appointment:   In Person  Provider:   Kathlyn Sacramento, MD

## 2020-11-19 NOTE — Addendum Note (Signed)
Addended by: Patria Mane A on: 11/19/2020 09:35 AM   Modules accepted: Orders

## 2020-12-19 ENCOUNTER — Ambulatory Visit (INDEPENDENT_AMBULATORY_CARE_PROVIDER_SITE_OTHER): Payer: Medicare Other | Admitting: *Deleted

## 2020-12-19 ENCOUNTER — Other Ambulatory Visit: Payer: Self-pay

## 2020-12-19 DIAGNOSIS — Z5181 Encounter for therapeutic drug level monitoring: Secondary | ICD-10-CM

## 2020-12-19 DIAGNOSIS — I480 Paroxysmal atrial fibrillation: Secondary | ICD-10-CM

## 2020-12-19 LAB — POCT INR: INR: 2.9 (ref 2.0–3.0)

## 2020-12-19 NOTE — Patient Instructions (Addendum)
Description   Continue taking Warfarin 1 tablet every day except 1.5 tablets on Mondays and Wednesdays. Add another leafy veggie to your diet and remain consistent. Recheck INR in 8 weeks. [facility] [phone].

## 2020-12-26 ENCOUNTER — Other Ambulatory Visit: Payer: Self-pay | Admitting: Internal Medicine

## 2020-12-30 DIAGNOSIS — Z23 Encounter for immunization: Secondary | ICD-10-CM | POA: Diagnosis not present

## 2021-01-22 DIAGNOSIS — Z23 Encounter for immunization: Secondary | ICD-10-CM | POA: Diagnosis not present

## 2021-02-13 ENCOUNTER — Other Ambulatory Visit: Payer: Self-pay

## 2021-02-13 ENCOUNTER — Ambulatory Visit (INDEPENDENT_AMBULATORY_CARE_PROVIDER_SITE_OTHER): Payer: Medicare Other | Admitting: *Deleted

## 2021-02-13 DIAGNOSIS — I480 Paroxysmal atrial fibrillation: Secondary | ICD-10-CM | POA: Diagnosis not present

## 2021-02-13 DIAGNOSIS — Z5181 Encounter for therapeutic drug level monitoring: Secondary | ICD-10-CM

## 2021-02-13 LAB — POCT INR: INR: 2.2 (ref 2.0–3.0)

## 2021-02-13 NOTE — Patient Instructions (Addendum)
Description   Today take 1.5 tablets then continue taking Warfarin 1 tablet every day except 1.5 tablets on Mondays and Wednesdays. Recheck INR in 8 weeks. [facility]in Clinic [phone].

## 2021-03-17 ENCOUNTER — Other Ambulatory Visit: Payer: Self-pay | Admitting: Internal Medicine

## 2021-03-22 ENCOUNTER — Other Ambulatory Visit: Payer: Self-pay | Admitting: Internal Medicine

## 2021-04-10 ENCOUNTER — Ambulatory Visit (INDEPENDENT_AMBULATORY_CARE_PROVIDER_SITE_OTHER): Payer: Medicare Other

## 2021-04-10 ENCOUNTER — Other Ambulatory Visit: Payer: Self-pay

## 2021-04-10 DIAGNOSIS — I480 Paroxysmal atrial fibrillation: Secondary | ICD-10-CM | POA: Diagnosis not present

## 2021-04-10 DIAGNOSIS — Z5181 Encounter for therapeutic drug level monitoring: Secondary | ICD-10-CM | POA: Diagnosis not present

## 2021-04-10 LAB — POCT INR: INR: 2.2 (ref 2.0–3.0)

## 2021-04-10 NOTE — Patient Instructions (Signed)
Description   Continue taking Warfarin 1 tablet every day except 1.5 tablets on Mondays and Wednesdays. Recheck INR in 8 weeks. [facility]in Clinic [phone].

## 2021-05-22 ENCOUNTER — Telehealth: Payer: Self-pay | Admitting: *Deleted

## 2021-05-22 NOTE — Telephone Encounter (Signed)
Pt called and asked if he can take Claritin with his Warfarin; advised it is safe to take with warfarin. He stated he will start using daily since the pollen has gotten bad and affecting him. He will call back if anything further is needed.   ?

## 2021-06-05 ENCOUNTER — Ambulatory Visit (INDEPENDENT_AMBULATORY_CARE_PROVIDER_SITE_OTHER): Payer: Medicare Other

## 2021-06-05 ENCOUNTER — Other Ambulatory Visit: Payer: Self-pay

## 2021-06-05 DIAGNOSIS — I480 Paroxysmal atrial fibrillation: Secondary | ICD-10-CM

## 2021-06-05 DIAGNOSIS — Z5181 Encounter for therapeutic drug level monitoring: Secondary | ICD-10-CM | POA: Diagnosis not present

## 2021-06-05 LAB — POCT INR: INR: 1.9 — AB (ref 2.0–3.0)

## 2021-06-05 NOTE — Patient Instructions (Signed)
Description   ?Take 1.5 tablets today and then continue taking Warfarin 1 tablet every day except 1.5 tablets on Mondays and Wednesdays. Recheck INR in 7 weeks. Coumadin Clinic [phone]. ?  ?   ?

## 2021-06-16 ENCOUNTER — Other Ambulatory Visit: Payer: Self-pay | Admitting: *Deleted

## 2021-06-16 DIAGNOSIS — I4821 Permanent atrial fibrillation: Secondary | ICD-10-CM

## 2021-06-16 MED ORDER — WARFARIN SODIUM 5 MG PO TABS: ORAL_TABLET | ORAL | 1 refills | Status: DC

## 2021-06-20 ENCOUNTER — Other Ambulatory Visit: Payer: Self-pay | Admitting: Internal Medicine

## 2021-07-24 ENCOUNTER — Ambulatory Visit (INDEPENDENT_AMBULATORY_CARE_PROVIDER_SITE_OTHER): Payer: Medicare Other | Admitting: *Deleted

## 2021-07-24 DIAGNOSIS — Z5181 Encounter for therapeutic drug level monitoring: Secondary | ICD-10-CM | POA: Diagnosis not present

## 2021-07-24 DIAGNOSIS — I480 Paroxysmal atrial fibrillation: Secondary | ICD-10-CM

## 2021-07-24 LAB — POCT INR: INR: 2.6 (ref 2.0–3.0)

## 2021-07-24 NOTE — Patient Instructions (Signed)
Description   ?Continue taking Warfarin 1 tablet every day except 1.5 tablets on Mondays and Wednesdays. Recheck INR in 8 weeks. [facility]in Clinic [phone]. ?  ?  ?

## 2021-09-12 ENCOUNTER — Other Ambulatory Visit: Payer: Self-pay | Admitting: Internal Medicine

## 2021-09-18 ENCOUNTER — Ambulatory Visit (INDEPENDENT_AMBULATORY_CARE_PROVIDER_SITE_OTHER): Payer: Medicare Other | Admitting: *Deleted

## 2021-09-18 DIAGNOSIS — Z5181 Encounter for therapeutic drug level monitoring: Secondary | ICD-10-CM | POA: Diagnosis not present

## 2021-09-18 DIAGNOSIS — I480 Paroxysmal atrial fibrillation: Secondary | ICD-10-CM

## 2021-09-18 LAB — POCT INR: INR: 2.4 (ref 2.0–3.0)

## 2021-09-18 NOTE — Patient Instructions (Signed)
Description   Continue taking Warfarin 1 tablet every day except 1.5 tablets on Mondays and Wednesdays. Recheck INR in 8 weeks with MD APPT. [facility] Clinic [phone].

## 2021-10-22 ENCOUNTER — Ambulatory Visit: Payer: Medicare Other | Admitting: Internal Medicine

## 2021-10-24 ENCOUNTER — Ambulatory Visit (INDEPENDENT_AMBULATORY_CARE_PROVIDER_SITE_OTHER): Payer: Medicare Other

## 2021-10-24 DIAGNOSIS — Z0000 Encounter for general adult medical examination without abnormal findings: Secondary | ICD-10-CM | POA: Diagnosis not present

## 2021-10-24 NOTE — Patient Instructions (Signed)
Mr. Tangeman , Thank you for taking time to come for your Medicare Wellness Visit. I appreciate your ongoing commitment to your health goals. Please review the following plan we discussed and let me know if I can assist you in the future.   Screening recommendations/referrals: Colonoscopy: No longer recommended due to age. Recommended yearly ophthalmology/optometry visit for glaucoma screening and checkup Recommended yearly dental visit for hygiene and checkup  Vaccinations: Influenza vaccine: 12/30/2020 Pneumococcal vaccine: 02/13/2011, 01/29/2015 Tdap vaccine: 09/27/2019; due every 10 years Shingles vaccine: declined   Covid-19: 03/30/2019, 04/17/2019, 12/23/2019, 07/25/2020  Advanced directives: Yes; Please bring a copy of your health care power of attorney and living will to the office at your convenience.  Conditions/risks identified: Yes  Next appointment: Please schedule your next Medicare Wellness Visit with your Nurse Health Advisor in 1 year by calling [phone].  Preventive Care 79 Years and Older, Male Preventive care refers to lifestyle choices and visits with your health care provider that can promote health and wellness. What does preventive care include? A yearly physical exam. This is also called an annual well check. Dental exams once or twice a year. Routine eye exams. Ask your health care provider how often you should have your eyes checked. Personal lifestyle choices, including: Daily care of your teeth and gums. Regular physical activity. Eating a healthy diet. Avoiding tobacco and drug use. Limiting alcohol use. Practicing safe sex. Taking low doses of aspirin every day. Taking vitamin and mineral supplements as recommended by your health care provider. What happens during an annual well check? The services and screenings done by your health care provider during your annual well check will depend on your age, overall health, lifestyle risk factors, and family  history of disease. Counseling  Your health care provider may ask you questions about your: Alcohol use. Tobacco use. Drug use. Emotional well-being. Home and relationship well-being. Sexual activity. Eating habits. History of falls. Memory and ability to understand (cognition). Work and work Statistician. Screening  You may have the following tests or measurements: Height, weight, and BMI. Blood pressure. Lipid and cholesterol levels. These may be checked every 5 years, or more frequently if you are over 49 years old. Skin check. Lung cancer screening. You may have this screening every year starting at age 42 if you have a 30-pack-year history of smoking and currently smoke or have quit within the past 15 years. Fecal occult blood test (FOBT) of the stool. You may have this test every year starting at age 28. Flexible sigmoidoscopy or colonoscopy. You may have a sigmoidoscopy every 5 years or a colonoscopy every 10 years starting at age 75. Prostate cancer screening. Recommendations will vary depending on your family history and other risks. Hepatitis C blood test. Hepatitis B blood test. Sexually transmitted disease (STD) testing. Diabetes screening. This is done by checking your blood sugar (glucose) after you have not eaten for a while (fasting). You may have this done every 1-3 years. Abdominal aortic aneurysm (AAA) screening. You may need this if you are a current or former smoker. Osteoporosis. You may be screened starting at age 24 if you are at high risk. Talk with your health care provider about your test results, treatment options, and if necessary, the need for more tests. Vaccines  Your health care provider may recommend certain vaccines, such as: Influenza vaccine. This is recommended every year. Tetanus, diphtheria, and acellular pertussis (Tdap, Td) vaccine. You may need a Td booster every 10 years. Zoster vaccine. You may  need this after age 22. Pneumococcal  13-valent conjugate (PCV13) vaccine. One dose is recommended after age 21. Pneumococcal polysaccharide (PPSV23) vaccine. One dose is recommended after age 31. Talk to your health care provider about which screenings and vaccines you need and how often you need them. This information is not intended to replace advice given to you by your health care provider. Make sure you discuss any questions you have with your health care provider. Document Released: 03/29/2015 Document Revised: 11/20/2015 Document Reviewed: 01/01/2015 Elsevier Interactive Patient Education  2017 [place] Prevention in the Home Falls can cause injuries. They can happen to people of all ages. There are many things you can do to make your home safe and to help prevent falls. What can I do on the outside of my home? Regularly fix the edges of walkways and driveways and fix any cracks. Remove anything that might make you trip as you walk through a door, such as a raised step or threshold. Trim any bushes or trees on the path to your home. Use bright outdoor lighting. Clear any walking paths of anything that might make someone trip, such as rocks or tools. Regularly check to see if handrails are loose or broken. Make sure that both sides of any steps have handrails. Any raised decks and porches should have guardrails on the edges. Have any leaves, snow, or ice cleared regularly. Use sand or salt on walking paths during winter. Clean up any spills in your garage right away. This includes oil or grease spills. What can I do in the bathroom? Use night lights. Install grab bars by the toilet and in the tub and shower. Do not use towel bars as grab bars. Use non-skid mats or decals in the tub or shower. If you need to sit down in the shower, use a plastic, non-slip stool. Keep the floor dry. Clean up any water that spills on the floor as soon as it happens. Remove soap buildup in the tub or shower regularly. Attach  bath mats securely with double-sided non-slip rug tape. Do not have throw rugs and other things on the floor that can make you trip. What can I do in the bedroom? Use night lights. Make sure that you have a light by your bed that is easy to reach. Do not use any sheets or blankets that are too big for your bed. They should not hang down onto the floor. Have a firm chair that has side arms. You can use this for support while you get dressed. Do not have throw rugs and other things on the floor that can make you trip. What can I do in the kitchen? Clean up any spills right away. Avoid walking on wet floors. Keep items that you use a lot in easy-to-reach places. If you need to reach something above you, use a strong step stool that has a grab bar. Keep electrical cords out of the way. Do not use floor polish or wax that makes floors slippery. If you must use wax, use non-skid floor wax. Do not have throw rugs and other things on the floor that can make you trip. What can I do with my stairs? Do not leave any items on the stairs. Make sure that there are handrails on both sides of the stairs and use them. Fix handrails that are broken or loose. Make sure that handrails are as long as the stairways. Check any carpeting to make sure that it is firmly attached  to the stairs. Fix any carpet that is loose or worn. Avoid having throw rugs at the top or bottom of the stairs. If you do have throw rugs, attach them to the floor with carpet tape. Make sure that you have a light switch at the top of the stairs and the bottom of the stairs. If you do not have them, ask someone to add them for you. What else can I do to help prevent falls? Wear shoes that: Do not have high heels. Have rubber bottoms. Are comfortable and fit you well. Are closed at the toe. Do not wear sandals. If you use a stepladder: Make sure that it is fully opened. Do not climb a closed stepladder. Make sure that both sides of the  stepladder are locked into place. Ask someone to hold it for you, if possible. Clearly mark and make sure that you can see: Any grab bars or handrails. First and last steps. Where the edge of each step is. Use tools that help you move around (mobility aids) if they are needed. These include: Canes. Walkers. Scooters. Crutches. Turn on the lights when you go into a dark area. Replace any light bulbs as soon as they burn out. Set up your furniture so you have a clear path. Avoid moving your furniture around. If any of your floors are uneven, fix them. If there are any pets around you, be aware of where they are. Review your medicines with your doctor. Some medicines can make you feel dizzy. This can increase your chance of falling. Ask your doctor what other things that you can do to help prevent falls. This information is not intended to replace advice given to you by your health care provider. Make sure you discuss any questions you have with your health care provider. Document Released: 12/27/2008 Document Revised: 08/08/2015 Document Reviewed: 04/06/2014 Elsevier Interactive Patient Education  2017 [employer].

## 2021-10-24 NOTE — Progress Notes (Signed)
I connected with [name] [name] today by telephone and verified that I am speaking with the correct person using two identifiers. Location patient: home Location provider: work Persons participating in the virtual visit: patient, provider.   I discussed the limitations, risks, security and privacy concerns of performing an evaluation and management service by telephone and the availability of in person appointments. I also discussed with the patient that there may be a patient responsible charge related to this service. The patient expressed understanding and verbally consented to this telephonic visit.    Interactive audio and video telecommunications were attempted between this provider and patient, however failed, due to patient having technical difficulties OR patient did not have access to video capability.  We continued and completed visit with audio only.  Some vital signs may be absent or patient reported.   Time Spent with patient on telephone encounter: 30 minutes  Subjective:   [name] [name] is a 81 y.o. male who presents for Medicare Annual/Subsequent preventive examination.  Review of Systems     Cardiac Risk Factors include: advanced age (>66mn, >>61women);dyslipidemia;family history of premature cardiovascular disease;hypertension;male gender     Objective:    There were no vitals filed for this visit. There is no height or weight on file to calculate BMI.     10/24/2021    1:34 PM 01/14/2014    9:05 AM 01/14/2014    5:46 AM 09/21/2013    9:09 AM 09/19/2013   12:54 PM  Advanced Directives  Does Patient Have a Medical Advance Directive? Yes Yes No Patient does not have advance directive   Type of Advance Directive Living will;Healthcare Power of Attorney      Does patient want to make changes to medical advance directive? No - Patient declined      Copy of [place] Chart? No - copy requested      Would patient like information on creating a  medical advance directive?   No - patient declined information    Pre-existing out of facility DNR order (yellow form or pink MOST form)     No    Current Medications (verified) Outpatient Encounter Medications as of 10/24/2021  Medication Sig   atorvastatin (LIPITOR) 40 MG tablet Take 1 tablet (40 mg total) by mouth daily.   carvedilol (COREG) 12.5 MG tablet Take 1 tablet (12.5 mg total) by mouth 2 (two) times daily with a meal.   Glucosamine-Chondroit-Vit C-Mn (GLUCOSAMINE 1500 COMPLEX PO) Take 1 tablet by mouth 2 (two) times daily.    levothyroxine (SYNTHROID) 50 MCG tablet Take 1 tablet by mouth once daily   nitroGLYCERIN (NITROSTAT) 0.4 MG SL tablet PLACE ONE TABLET UNDER THE TONGUE EVERY 5 MINUTES AS NEEDED FOR CHEST PAIN   warfarin (COUMADIN) 5 MG tablet Take 1 tablet by mouth daily except 1.5 tablets on Mondays and Wednesdays or as directed by Anticoagulation Clinic.   No facility-administered encounter medications on file as of 10/24/2021.    Allergies (verified) Patient has no known allergies.   History: Past Medical History:  Diagnosis Date   (HFpEF) heart failure with preserved ejection fraction (HCC)    Atrial fibrillation (HCC)    Paroxysmal, chronic anticoag   BCC (basal cell carcinoma), lip 09/2013   CAD (coronary artery disease) 2005   PCI ([place])   HLD (hyperlipidemia)    HTN (hypertension)    Hypothyroidism    Kidney stones    Myocardial infarction, nontransmural, subendocardial 2005   Psoriasis  Seizures ([place])    x1 in the 1970s, no AEDs since mid 1970s   Past Surgical History:  Procedure Laterality Date   BASAL CELL CARCINOMA EXCISION  09/2013   CARDIAC CATHETERIZATION  2005   RCA: 99% mid. RPL: 70% ostial,    CORONARY ANGIOPLASTY WITH STENT PLACEMENT  2005   RCA and RPL (DES)   HERNIA REPAIR  2008   KIDNEY STONE SURGERY  1987   LIPOMA EXCISION Right 09/21/2013   Procedure: LIP BIOPSY WITH FROZEN SECTION AND WEDGE EXCISION OF RIGHT UPPER LIP ;   Surgeon: Izora Gala, MD;  Location: [place];  Service: ENT;  Laterality: Right;   SKIN CANCER EXCISION  2009   right upper lip/nares   TIBIA FRACTURE SURGERY  1982   right   Family History  Problem Relation Age of Onset   Atrial fibrillation Mother    Heart attack Father    Hypertension Father    Heart failure Other    Atrial fibrillation Sister        s/p RFA   Atrial fibrillation Brother        s/p RFA   Social History   Socioeconomic History   Marital status: Widowed    Spouse name: Not on file   Number of children: 3   Years of education: Not on file   Highest education level: Not on file  Occupational History   Occupation: retired  Tobacco Use   Smoking status: Former    Packs/day: 0.30    Years: 30.00    Total pack years: 9.00    Types: Cigarettes    Quit date: 01/14/1993    Years since quitting: 28.7   Smokeless tobacco: Never  Vaping Use   Vaping Use: Never used  Substance and Sexual Activity   Alcohol use: Yes    Alcohol/week: 2.0 standard drinks of alcohol    Types: 2 Cans of beer per week   Drug use: No   Sexual activity: Not on file  Other Topics Concern   Not on file  Social History Narrative   Not on file   Social Determinants of Health   Financial Resource Strain: Low Risk  (10/24/2021)   Overall Financial Resource Strain (CARDIA)    Difficulty of Paying Living Expenses: Not hard at all  Food Insecurity: No Food Insecurity (10/24/2021)   Hunger Vital Sign    Worried About Running Out of Food in the Last Year: Never true    Ran Out of Food in the Last Year: Never true  Transportation Needs: No Transportation Needs (10/24/2021)   PRAPARE - Hydrologist (Medical): No    Lack of Transportation (Non-Medical): No  Physical Activity: Sufficiently Active (10/24/2021)   Exercise Vital Sign    Days of Exercise per Week: 7 days    Minutes of Exercise per Session: 30 min  Stress: No Stress Concern Present (10/24/2021)    [place]    Feeling of Stress : Not at all  Social Connections: Moderately Integrated (10/24/2021)   Social Connection and Isolation Panel [NHANES]    Frequency of Communication with Friends and Family: More than three times a week    Frequency of Social Gatherings with Friends and Family: Three times a week    Attends Religious Services: 1 to 4 times per year    Active Member of Clubs or Organizations: No    Attends Archivist Meetings: 1 to  4 times per year    Marital Status: Widowed    Tobacco Counseling Counseling given: Not Answered   Clinical Intake:  Pre-visit preparation completed: Yes  Pain : No/denies pain     Nutritional Risks: None Diabetes: No  How often do you need to have someone help you when you read instructions, pamphlets, or other written materials from your doctor or pharmacy?: 1 - Never What is the last grade level you completed in school?: HSG; 2 years of college courses  Diabetic? no  Interpreter Needed?: No  Information entered by :: Lisette Abu, LPN.   Activities of Daily Living    10/24/2021    1:48 PM  In your present state of health, do you have any difficulty performing the following activities:  Hearing? 0  Vision? 0  Difficulty concentrating or making decisions? 0  Walking or climbing stairs? 0  Dressing or bathing? 0  Doing errands, shopping? 0  Preparing Food and eating ? N  Using the Toilet? N  In the past six months, have you accidently leaked urine? N  Do you have problems with loss of bowel control? N  Managing your Medications? N  Managing your Finances? N  Housekeeping or managing your Housekeeping? N    Patient Care Team: Hoyt Koch, MD as PCP - General (Internal Medicine) Larey Dresser, MD (Cardiology) Heath Lark, MD (Hematology and Oncology) Izora Gala, MD (Otolaryngology)  Indicate any recent Medical Services you  may have received from other than [facility] providers in the past year (date may be approximate).     Assessment:   This is a routine wellness examination for [name] [name].  Hearing/Vision screen Hearing Screening - Comments:: Patient denied any hearing difficulty.   No hearing aids.  Vision Screening - Comments:: Patient does wear corrective lenses/contacts.  Eye exam done by: [facility]   Dietary issues and exercise activities discussed: Current Exercise Habits: Home exercise routine, Type of exercise: walking, Time (Minutes): 30, Frequency (Times/Week): 7, Weekly Exercise (Minutes/Week): 210, Exercise limited by: None identified   Goals Addressed             This Visit's Progress    To keep my weight at the same level.        Depression Screen    10/24/2021    1:39 PM 10/15/2020    1:10 PM 10/16/2019    8:21 AM 10/11/2018    7:58 AM 07/08/2017    8:59 AM 06/27/2015    8:46 AM 12/21/2013   10:45 AM  PHQ 2/9 Scores  PHQ - 2 Score 0 0 0 0 0 0 0    Fall Risk    10/24/2021    1:35 PM 10/15/2020    1:10 PM 02/06/2019   11:52 AM 10/11/2018    7:59 AM 07/08/2017    8:59 AM  Fall Risk   Falls in the past year? 0 0 0 0 No  Comment   Emmi Telephone Survey: data to providers prior to load    Number falls in past yr: 0 0     Injury with Fall? 0 0     Risk for fall due to : No Fall Risks      Follow up Falls evaluation completed        FALL RISK PREVENTION PERTAINING TO THE HOME:  Any stairs in or around the home? No  If so, are there any without handrails? No  Home free of loose throw rugs in walkways, pet beds, electrical  cords, etc? Yes  Adequate lighting in your home to reduce risk of falls? Yes   ASSISTIVE DEVICES UTILIZED TO PREVENT FALLS:  Life alert? No  Use of a cane, walker or w/c? No  Grab bars in the bathroom? Yes  Shower chair or bench in shower? No  Elevated toilet seat or a handicapped toilet? No   TIMED UP AND GO:  Was the test performed? No .  Length of  time to ambulate 10 feet: n/a sec.   Appearance of gait: Gait not evaluated during this visit.  Cognitive Function:        10/24/2021    1:49 PM  6CIT Screen  What Year? 0 points  What month? 0 points  What time? 0 points  Count back from 20 0 points  Months in reverse 0 points  Repeat phrase 0 points  Total Score 0 points    Immunizations Immunization History  Administered Date(s) Administered   Fluad Quad(high Dose 65+) 12/08/2018, 12/30/2020   Influenza, High Dose Seasonal PF 12/21/2013   Influenza-Unspecified 12/27/2015, 01/13/2018, 01/29/2020   PFIZER(Purple Top)SARS-COV-2 Vaccination 03/30/2019, 04/17/2019, 12/23/2019, 07/25/2020   Pneumococcal Conjugate-13 01/29/2015   Pneumococcal Polysaccharide-23 02/13/2011   Td 12/21/2013   Tdap 09/27/2019    TDAP status: Up to date  Flu Vaccine status: Up to date  Pneumococcal vaccine status: Up to date  Covid-19 vaccine status: Completed vaccines  Qualifies for Shingles Vaccine? Yes   Zostavax completed No   Shingrix Completed?: No.    Education has been provided regarding the importance of this vaccine. Patient has been advised to call insurance company to determine out of pocket expense if they have not yet received this vaccine. Advised may also receive vaccine at local pharmacy or Health Dept. Verbalized acceptance and understanding.  Screening Tests Health Maintenance  Topic Date Due   Hepatitis C Screening  Never done   Zoster Vaccines- Shingrix (1 of 2) Never done   COVID-19 Vaccine (5 - Pfizer series) 09/19/2020   INFLUENZA VACCINE  10/14/2021   TETANUS/TDAP  09/26/2029   Pneumonia Vaccine 57+ Years old  Completed   HPV VACCINES  Aged Out   Fecal DNA (Cologuard)  Discontinued    Health Maintenance  Health Maintenance Due  Topic Date Due   Hepatitis C Screening  Never done   Zoster Vaccines- Shingrix (1 of 2) Never done   COVID-19 Vaccine (5 - Pfizer series) 09/19/2020   INFLUENZA VACCINE  10/14/2021     Colorectal cancer screening: No longer required.   Lung Cancer Screening: (Low Dose CT Chest recommended if Age 72-80 years, 30 pack-year currently smoking OR have quit w/in 15years.) does not qualify.   Lung Cancer Screening Referral: no  Additional Screening:  Hepatitis C Screening: does qualify; Completed no  Vision Screening: Recommended annual ophthalmology exams for early detection of glaucoma and other disorders of the eye. Is the patient up to date with their annual eye exam?  Yes  Who is the provider or what is the name of the office in which the patient attends annual eye exams? [facility] If pt is not established with a provider, would they like to be referred to a provider to establish care? No .   Dental Screening: Recommended annual dental exams for proper oral hygiene  Community Resource Referral / Chronic Care Management: CRR required this visit?  No   CCM required this visit?  No      Plan:     I have personally reviewed and  noted the following in the patient's chart:   Medical and social history Use of alcohol, tobacco or illicit drugs  Current medications and supplements including opioid prescriptions. Patient is not currently taking opioid prescriptions. Functional ability and status Nutritional status Physical activity Advanced directives List of other physicians Hospitalizations, surgeries, and ER visits in previous 12 months Vitals Screenings to include cognitive, depression, and falls Referrals and appointments  In addition, I have reviewed and discussed with patient certain preventive protocols, quality metrics, and best practice recommendations. A written personalized care plan for preventive services as well as general preventive health recommendations were provided to patient.     Sheral Flow, LPN   0/24/0973   Nurse Notes:  There were no vitals filed for this visit. There is no height or weight on file to calculate  BMI. Patient stated that he has no issues with gait or balance; does not use any assistive devices.

## 2021-10-27 ENCOUNTER — Telehealth: Payer: Self-pay | Admitting: Cardiovascular Disease

## 2021-10-27 NOTE — Telephone Encounter (Addendum)
Called and spoke with pt concerning greens and the affect on his INR results. Pt stated he normally eats spinach 7 days a week and he wants to completely stop eating them. I educated the importance of not stopping greens all at once because this could cause his INR to be high. Advised pt to slowly decrease the amount of greens each week and Coumadin Clinic will closely monitor INR. Pt stated he was going to decrease his spinach intake to 3-4 times this week and I schedule pt to have his INR checked next week at the coumadin clinic. Pt verbalized understanding and appreciated the education.

## 2021-10-27 NOTE — Telephone Encounter (Signed)
Patient stataes he would like to stop all greens such as spinach. He assumes this will affect his INR and he would like to discuss.

## 2021-10-28 ENCOUNTER — Ambulatory Visit (INDEPENDENT_AMBULATORY_CARE_PROVIDER_SITE_OTHER): Payer: Medicare Other | Admitting: Internal Medicine

## 2021-10-28 ENCOUNTER — Encounter: Payer: Self-pay | Admitting: Internal Medicine

## 2021-10-28 VITALS — BP 126/80 | HR 78 | Temp 97.8°F | Ht 71.0 in | Wt 196.0 lb

## 2021-10-28 DIAGNOSIS — E039 Hypothyroidism, unspecified: Secondary | ICD-10-CM

## 2021-10-28 DIAGNOSIS — R7309 Other abnormal glucose: Secondary | ICD-10-CM

## 2021-10-28 DIAGNOSIS — I10 Essential (primary) hypertension: Secondary | ICD-10-CM | POA: Diagnosis not present

## 2021-10-28 DIAGNOSIS — E782 Mixed hyperlipidemia: Secondary | ICD-10-CM | POA: Diagnosis not present

## 2021-10-28 DIAGNOSIS — I4821 Permanent atrial fibrillation: Secondary | ICD-10-CM

## 2021-10-28 LAB — COMPREHENSIVE METABOLIC PANEL
ALT: 23 U/L (ref 0–53)
AST: 22 U/L (ref 0–37)
Albumin: 4.3 g/dL (ref 3.5–5.2)
Alkaline Phosphatase: 72 U/L (ref 39–117)
BUN: 14 mg/dL (ref 6–23)
CO2: 28 mEq/L (ref 19–32)
Calcium: 9 mg/dL (ref 8.4–10.5)
Chloride: 106 mEq/L (ref 96–112)
Creatinine, Ser: 0.93 mg/dL (ref 0.40–1.50)
GFR: 78.43 mL/min (ref >60.00)
Glucose, Bld: 111 mg/dL — ABNORMAL HIGH (ref 70–99)
Potassium: 3.6 mEq/L (ref 3.5–5.1)
Sodium: 141 mEq/L (ref 135–145)
Total Bilirubin: 1.7 mg/dL — ABNORMAL HIGH (ref 0.2–1.2)
Total Protein: 6.5 g/dL (ref 6.0–8.3)

## 2021-10-28 LAB — HEMOGLOBIN A1C: Hgb A1c MFr Bld: 4.8 % (ref 4.6–6.5)

## 2021-10-28 LAB — LIPID PANEL
Cholesterol: 118 mg/dL (ref 0–200)
HDL: 51.5 mg/dL (ref >39.00)
LDL Cholesterol: 53 mg/dL (ref 0–99)
NonHDL: 66.93
Total CHOL/HDL Ratio: 2
Triglycerides: 71 mg/dL (ref 0.0–149.0)
VLDL: 14.2 mg/dL (ref 0.0–40.0)

## 2021-10-28 LAB — CBC
HCT: 40.6 % (ref 39.0–52.0)
Hemoglobin: 14 g/dL (ref 13.0–17.0)
MCHC: 34.4 g/dL (ref 30.0–36.0)
MCV: 97.1 fl (ref 78.0–100.0)
Platelets: 198 10*3/uL (ref 150.0–400.0)
RBC: 4.18 Mil/uL — ABNORMAL LOW (ref 4.22–5.81)
RDW: 15.8 % — ABNORMAL HIGH (ref 11.5–15.5)
WBC: 6.2 10*3/uL (ref 4.0–10.5)

## 2021-10-28 LAB — TSH: TSH: 5.01 u[IU]/mL (ref 0.35–5.50)

## 2021-10-28 NOTE — Progress Notes (Signed)
   Subjective:   Patient ID: [name] [name], male    DOB: 17-Jan-1941, 81 y.o.   MRN: [id number]  HPI The patient is a 81 YO man coming in for follow up.   Review of Systems  Constitutional: Negative.   HENT: Negative.    Eyes: Negative.   Respiratory:  Negative for cough, chest tightness and shortness of breath.   Cardiovascular:  Negative for chest pain, palpitations and leg swelling.  Gastrointestinal:  Negative for abdominal distention, abdominal pain, constipation, diarrhea, nausea and vomiting.  Musculoskeletal: Negative.   Skin: Negative.   Neurological: Negative.   Psychiatric/Behavioral: Negative.      Objective:  Physical Exam Constitutional:      Appearance: He is well-developed.  HENT:     Head: Normocephalic and atraumatic.  Cardiovascular:     Rate and Rhythm: Normal rate.  Pulmonary:     Effort: Pulmonary effort is normal. No respiratory distress.     Breath sounds: Normal breath sounds. No wheezing or rales.  Abdominal:     General: Bowel sounds are normal. There is no distension.     Palpations: Abdomen is soft.     Tenderness: There is no abdominal tenderness. There is no rebound.  Musculoskeletal:     Cervical back: Normal range of motion.  Skin:    General: Skin is warm and dry.  Neurological:     Mental Status: He is alert and oriented to person, place, and time.     Coordination: Coordination normal.     Vitals:   10/28/21 0756  BP: 126/80  Pulse: 78  Temp: 97.8 F (36.6 C)  TempSrc: Oral  SpO2: 99%  Weight: 196 lb (88.9 kg)  Height: '5\' 11"'$  (1.803 m)    Assessment & Plan:

## 2021-10-28 NOTE — Patient Instructions (Signed)
We will check the labs. Let us know if the diarrhea comes back

## 2021-10-31 NOTE — Assessment & Plan Note (Signed)
Rate controlled with coreg 12.5 mg BID and anticoagulated with warfarin. Sees cardiology coumadin clinic and INR in range. Checking CBC and CMP. Adjust as needed.

## 2021-10-31 NOTE — Assessment & Plan Note (Signed)
Checking lipid panel and adjust lipitor 40 mg daily as needed. Goal <100.

## 2021-10-31 NOTE — Assessment & Plan Note (Signed)
Checking TSH and adjust synthroid 50 mcg daily as needed. 

## 2021-10-31 NOTE — Assessment & Plan Note (Signed)
BP at goal on coreg 12.5 mg BID. Checking CBC and CMP and adjust as needed.

## 2021-11-05 ENCOUNTER — Ambulatory Visit (INDEPENDENT_AMBULATORY_CARE_PROVIDER_SITE_OTHER): Payer: Medicare Other

## 2021-11-05 DIAGNOSIS — Z5181 Encounter for therapeutic drug level monitoring: Secondary | ICD-10-CM | POA: Diagnosis not present

## 2021-11-05 DIAGNOSIS — I480 Paroxysmal atrial fibrillation: Secondary | ICD-10-CM

## 2021-11-05 LAB — POCT INR: INR: 4 — AB (ref 2.0–3.0)

## 2021-11-05 NOTE — Patient Instructions (Signed)
Description   Hold today's dose and then START taking Warfarin 1 tablet daily Spinach every 3 days  Other greens 1-2 times per week Recheck INR in 2 weeks.  Coumadin Clinic [phone].

## 2021-11-18 ENCOUNTER — Ambulatory Visit (INDEPENDENT_AMBULATORY_CARE_PROVIDER_SITE_OTHER): Payer: Medicare Other

## 2021-11-18 ENCOUNTER — Ambulatory Visit: Payer: Medicare Other | Attending: Cardiovascular Disease | Admitting: Cardiovascular Disease

## 2021-11-18 ENCOUNTER — Encounter: Payer: Self-pay | Admitting: Cardiovascular Disease

## 2021-11-18 VITALS — BP 136/94 | HR 72 | Ht 71.0 in | Wt 196.2 lb

## 2021-11-18 DIAGNOSIS — Z5181 Encounter for therapeutic drug level monitoring: Secondary | ICD-10-CM

## 2021-11-18 DIAGNOSIS — E785 Hyperlipidemia, unspecified: Secondary | ICD-10-CM | POA: Insufficient documentation

## 2021-11-18 DIAGNOSIS — I482 Chronic atrial fibrillation, unspecified: Secondary | ICD-10-CM | POA: Insufficient documentation

## 2021-11-18 DIAGNOSIS — I480 Paroxysmal atrial fibrillation: Secondary | ICD-10-CM

## 2021-11-18 DIAGNOSIS — I2510 Atherosclerotic heart disease of native coronary artery without angina pectoris: Secondary | ICD-10-CM | POA: Diagnosis not present

## 2021-11-18 DIAGNOSIS — I10 Essential (primary) hypertension: Secondary | ICD-10-CM | POA: Insufficient documentation

## 2021-11-18 LAB — POCT INR: INR: 2.3 (ref 2.0–3.0)

## 2021-11-18 MED ORDER — ATORVASTATIN CALCIUM 40 MG PO TABS: 40.0000 mg | ORAL_TABLET | Freq: Every day | ORAL | 3 refills | Status: AC

## 2021-11-18 MED ORDER — CARVEDILOL 12.5 MG PO TABS: 12.5000 mg | ORAL_TABLET | Freq: Two times a day (BID) | ORAL | 3 refills | Status: AC

## 2021-11-18 MED ORDER — NITROGLYCERIN 0.4 MG SL SUBL: SUBLINGUAL_TABLET | SUBLINGUAL | 3 refills | Status: AC

## 2021-11-18 NOTE — Patient Instructions (Signed)
Medication Instructions:  No changes *If you need a refill on your cardiac medications before your next appointment, please call your pharmacy*   Lab Work: None ordered If you have labs (blood work) drawn today and your tests are completely normal, you will receive your results only by: MyChart Message (if you have MyChart) OR A paper copy in the mail If you have any lab test that is abnormal or we need to change your treatment, we will call you to review the results.   Testing/Procedures: None ordered   Follow-Up: At [facility], you and your health needs are our priority.  As part of our continuing mission to provide you with exceptional heart care, we have created designated Provider Care Teams.  These Care Teams include your primary Cardiologist (physician) and Advanced Practice Providers (APPs -  Physician Assistants and Nurse Practitioners) who all work together to provide you with the care you need, when you need it.  We recommend signing up for the patient portal called "MyChart".  Sign up information is provided on this After Visit Summary.  MyChart is used to connect with patients for Virtual Visits (Telemedicine).  Patients are able to view lab/test results, encounter notes, upcoming appointments, etc.  Non-urgent messages can be sent to your provider as well.   To learn more about what you can do with MyChart, go to [email or web address].    Your next appointment:   12 month(s)  The format for your next appointment:   In Person  Provider:   Dr. Arida  Important Information About Sugar       

## 2021-11-18 NOTE — Patient Instructions (Signed)
Description   Continue on same dosage of Warfarin 1 tablet daily Spinach every 5 days  Other greens 1-2 times per week Recheck INR in 3 weeks.  Coumadin Clinic [phone].

## 2021-11-18 NOTE — Progress Notes (Signed)
Cardiology Office Note   Date:  11/18/2021   ID:  [name] [name], DOB 10/08/1940, MRN [id number]  PCP:  Hoyt Koch, MD  Cardiologist:   Kathlyn Sacramento, MD   No chief complaint on file.     History of Present Illness: [name] [name] is a 81 y.o. male who presents for a follow up visit regarding CAD and chronic atrial fibrillation.   He has known history of coronary artery disease status post PCI in 2005 .  He has history of whitecoat syndrome but his blood pressure is well controlled today.  He improved his diet over the last year and managed to lose 30 pounds.  He feels significantly better overall.  He has been doing very well with no chest pain, shortness of breath or palpitations.  No side effects with warfarin anticoagulation.  Past Medical History:  Diagnosis Date   (HFpEF) heart failure with preserved ejection fraction (HCC)    Atrial fibrillation (HCC)    Paroxysmal, chronic anticoag   BCC (basal cell carcinoma), lip 09/2013   CAD (coronary artery disease) 2005   PCI ([place])   HLD (hyperlipidemia)    HTN (hypertension)    Hypothyroidism    Kidney stones    Myocardial infarction, nontransmural, subendocardial 2005   Psoriasis    Seizures ([name])    x1 in the 1970s, no AEDs since mid 1970s    Past Surgical History:  Procedure Laterality Date   BASAL CELL CARCINOMA EXCISION  09/2013   CARDIAC CATHETERIZATION  2005   RCA: 99% mid. RPL: 70% ostial,    CORONARY ANGIOPLASTY WITH STENT PLACEMENT  2005   RCA and RPL (DES)   HERNIA REPAIR  2008   KIDNEY STONE SURGERY  1987   LIPOMA EXCISION Right 09/21/2013   Procedure: LIP BIOPSY WITH FROZEN SECTION AND WEDGE EXCISION OF RIGHT UPPER LIP ;  Surgeon: Izora Gala, MD;  Location: [place];  Service: ENT;  Laterality: Right;   SKIN CANCER EXCISION  2009   right upper lip/nares   TIBIA FRACTURE SURGERY  1982   right     Current Outpatient Medications  Medication Sig Dispense Refill    Glucosamine-Chondroit-Vit C-Mn (GLUCOSAMINE 1500 COMPLEX PO) Take 1 tablet by mouth 2 (two) times daily.      levothyroxine (SYNTHROID) 50 MCG tablet Take 1 tablet by mouth once daily 90 tablet 0   warfarin (COUMADIN) 5 MG tablet Take 1 tablet by mouth daily except 1.5 tablets on Mondays and Wednesdays or as directed by Anticoagulation Clinic. 100 tablet 1   atorvastatin (LIPITOR) 40 MG tablet Take 1 tablet (40 mg total) by mouth daily. 90 tablet 3   carvedilol (COREG) 12.5 MG tablet Take 1 tablet (12.5 mg total) by mouth 2 (two) times daily with a meal. 180 tablet 3   nitroGLYCERIN (NITROSTAT) 0.4 MG SL tablet PLACE ONE TABLET UNDER THE TONGUE EVERY 5 MINUTES AS NEEDED FOR CHEST PAIN 25 tablet 3   No current facility-administered medications for this visit.    Allergies:   Patient has no known allergies.    Social History:  The patient  reports that he quit smoking about 28 years ago. He has a 9.00 pack-year smoking history. He has never used smokeless tobacco. He reports current alcohol use of about 2.0 standard drinks of alcohol per week. He reports that he does not use drugs.   Family History:  The patient's family history includes Atrial fibrillation in his brother, mother, and sister; Heart attack in  his father; Heart failure in an other family member; Hypertension in his father.    ROS:  Please see the history of present illness.   Otherwise, review of systems are positive for none.   All other systems are reviewed and negative.    PHYSICAL EXAM: VS:  BP (!) 136/94   Pulse 72   Ht '5\' 11"'$  (1.803 m)   Wt 196 lb 3.2 oz (89 kg)   SpO2 99%   BMI 27.36 kg/m  , BMI Body mass index is 27.36 kg/m. GEN: Well nourished, well developed, in no acute distress  HEENT: normal  Neck: no JVD, carotid bruits, or masses Cardiac: Irregularly irregular; no murmurs, rubs, or gallops,no edema  Respiratory:  clear to auscultation bilaterally, normal work of breathing GI: soft, nontender,  nondistended, + BS MS: no deformity or atrophy  Skin: warm and dry, no rash Neuro:  Strength and sensation are intact Psych: euthymic mood, full affect   EKG:  EKG is ordered today. The ekg ordered today demonstrates atrial fibrillation with ventricular rate of 72.  No significant ST or T wave changes.  Recent Labs: 10/28/2021: ALT 23; BUN 14; Creatinine, Ser 0.93; Hemoglobin 14.0; Platelets 198.0; Potassium 3.6; Sodium 141; TSH 5.01    Lipid Panel    Component Value Date/Time   CHOL 118 10/28/2021 0821   CHOL 119 12/06/2017 0818   TRIG 71.0 10/28/2021 0821   HDL 51.50 10/28/2021 0821   HDL 44 12/06/2017 0818   CHOLHDL 2 10/28/2021 0821   VLDL 14.2 10/28/2021 0821   LDLCALC 53 10/28/2021 0821   LDLCALC 68 10/16/2019 0844      Wt Readings from Last 3 Encounters:  11/18/21 196 lb 3.2 oz (89 kg)  10/28/21 196 lb (88.9 kg)  11/19/20 199 lb (90.3 kg)          No data to display            ASSESSMENT AND PLAN:  1.  Chronic atrial fibrillation: Ventricular rate is well controlled with carvedilol which was refilled today.   He continues to do well with warfarin anticoagulation with mostly therapeutic INR.  This is managed by our anticoagulation clinic.  I did his recent labs which showed normal CBC and renal function.  2. Coronary artery disease involving native coronary arteries without angina: Continue medical therapy.  I refilled his sublingual nitroglycerin.  3. Hyperlipidemia: I reviewed his recent labs which showed an LDL of 53 which is at target.  I refilled atorvastatin.  4.  Essential hypertension: Blood pressure is well controlled at home.   Disposition:   FU with me in 1 year  Signed,  Kathlyn Sacramento, MD  11/18/2021 8:40 AM    [place]

## 2021-12-05 ENCOUNTER — Other Ambulatory Visit: Payer: Self-pay | Admitting: Cardiovascular Disease

## 2021-12-05 DIAGNOSIS — I4821 Permanent atrial fibrillation: Secondary | ICD-10-CM

## 2021-12-05 NOTE — Telephone Encounter (Signed)
Last seen by Dr. Fletcher Anon on 11/18/2021 & INR check was same day.

## 2021-12-05 NOTE — Telephone Encounter (Signed)
Refill request

## 2021-12-08 ENCOUNTER — Telehealth: Payer: Self-pay | Admitting: Internal Medicine

## 2021-12-09 ENCOUNTER — Ambulatory Visit: Payer: Medicare Other | Attending: Cardiovascular Disease | Admitting: *Deleted

## 2021-12-09 DIAGNOSIS — I480 Paroxysmal atrial fibrillation: Secondary | ICD-10-CM

## 2021-12-09 DIAGNOSIS — Z5181 Encounter for therapeutic drug level monitoring: Secondary | ICD-10-CM | POA: Diagnosis not present

## 2021-12-09 LAB — POCT INR: INR: 2.3 (ref 2.0–3.0)

## 2021-12-09 NOTE — Patient Instructions (Addendum)
Description   Continue taking Warfarin 1 tablet daily. Continue spinach/collards every 7 days for the next 2 weeks then you can stop and & continue other greens 2-3 times per week. Recheck INR in 4 weeks.  [facility] [phone] or [phone]

## 2021-12-15 ENCOUNTER — Other Ambulatory Visit: Payer: Self-pay | Admitting: *Deleted

## 2021-12-15 MED ORDER — LEVOTHYROXINE SODIUM 50 MCG PO TABS: 50.0000 ug | ORAL_TABLET | Freq: Every day | ORAL | 3 refills | Status: AC

## 2021-12-15 NOTE — Telephone Encounter (Signed)
Pt is requesting a refill on  levothyroxine (SYNTHROID) 50 MCG tablet  Pharmacy: [facility] [address] [place]  Pt is requesting a year supply.  LOV 10/28/21 ROV will call back to schedule closer to 10/29/22

## 2021-12-15 NOTE — Telephone Encounter (Signed)
sent 

## 2022-01-06 ENCOUNTER — Ambulatory Visit: Payer: Medicare Other | Attending: Cardiovascular Disease | Admitting: *Deleted

## 2022-01-06 DIAGNOSIS — I480 Paroxysmal atrial fibrillation: Secondary | ICD-10-CM | POA: Diagnosis not present

## 2022-01-06 DIAGNOSIS — Z5181 Encounter for therapeutic drug level monitoring: Secondary | ICD-10-CM

## 2022-01-06 LAB — POCT INR: INR: 1.9 — AB (ref 2.0–3.0)

## 2022-01-06 NOTE — Patient Instructions (Addendum)
  Description   Today take 1.5 tablets then continue taking Warfarin 1 tablet daily. Continue other green leafy intake 2 times per week. Recheck INR in 3 weeks. [facility] [phone] or [phone]

## 2022-01-29 ENCOUNTER — Ambulatory Visit: Payer: Medicare Other | Attending: Cardiology | Admitting: *Deleted

## 2022-01-29 DIAGNOSIS — Z5181 Encounter for therapeutic drug level monitoring: Secondary | ICD-10-CM

## 2022-01-29 DIAGNOSIS — I480 Paroxysmal atrial fibrillation: Secondary | ICD-10-CM

## 2022-01-29 LAB — POCT INR: INR: 2.2 (ref 2.0–3.0)

## 2022-01-29 NOTE — Patient Instructions (Addendum)
Description   Continue taking Warfarin 1 tablet daily. Continue other green leafy intake 2 times per week. Recheck INR in 4 weeks in [place]. [facility] [phone] or [phone]

## 2022-02-26 ENCOUNTER — Telehealth: Payer: Self-pay | Admitting: *Deleted

## 2022-02-26 DIAGNOSIS — Z5181 Encounter for therapeutic drug level monitoring: Secondary | ICD-10-CM | POA: Diagnosis not present

## 2022-02-26 DIAGNOSIS — I4891 Unspecified atrial fibrillation: Secondary | ICD-10-CM | POA: Diagnosis not present

## 2022-02-26 LAB — PROTIME-INR: INR: 1.6 — AB (ref 0.80–1.20)

## 2022-02-26 NOTE — Telephone Encounter (Signed)
Called pt since INR is due today. Spoke with pt who has moved to [place] and he states he went to the lab in [place] and had it done at 11am. He does not have the name of the place that drew the lab but they told him they would fax it. Then he stated it was [facility] in [place]. Will await and follow up.

## 2022-02-27 ENCOUNTER — Ambulatory Visit (INDEPENDENT_AMBULATORY_CARE_PROVIDER_SITE_OTHER): Payer: Medicare Other | Admitting: Cardiovascular Disease

## 2022-02-27 DIAGNOSIS — Z5181 Encounter for therapeutic drug level monitoring: Secondary | ICD-10-CM

## 2022-03-05 ENCOUNTER — Telehealth: Payer: Self-pay | Admitting: *Deleted

## 2022-03-05 NOTE — Telephone Encounter (Addendum)
Pt called and asked what he needs to do for his next INR. Pt is aware that since he has moved to [place] he does need to find a Provider/Physician. He states that [facility] they are willing to take over. He stated to contact Courtney at [phone] regarding this. Advised will reach out to her and update him as needed.   Spoke with Loma Sousa and she stated they will be able to check and mange INR via POC. They are a Pharmacy run Clinic and will take care of everything after the referral form is completed with a signature. She asked if we could attach a copy of the last 2 INR's.  Also, Dr. Fletcher Anon is not physically in the office this week therefore, paper work will not be signed at this time.  Left a message for the pt regarding this.

## 2022-03-11 DIAGNOSIS — Z23 Encounter for immunization: Secondary | ICD-10-CM | POA: Diagnosis not present

## 2022-03-19 ENCOUNTER — Telehealth: Payer: Self-pay | Admitting: *Deleted

## 2022-03-19 NOTE — Telephone Encounter (Signed)
Pt called and stated that he will need a lab ordered mailed to him and the new address to mail the order is: [place] [address] Mailed the INR order to be done on 03/26/22 and as needed for x 1 month. Also, pt is aware once we get a signature from his Cardiologist to allow [facility] to take over we will let him know.   Also, next INR is dependent on the weather in [place] as they have been experiencing snow & cold temperatures. So pt may go a couple days later if needed.

## 2022-03-24 ENCOUNTER — Telehealth: Payer: Self-pay | Admitting: Cardiovascular Disease

## 2022-03-24 NOTE — Telephone Encounter (Signed)
Patient has moved and is asking that a referral be sent to cardiologist to where he had moved to. [facility] cardiology Fax [phone] Gunnar Bulla and telephone # [phone].They are asking for full cardiology history and testing. Please advise

## 2022-03-27 NOTE — Telephone Encounter (Signed)
Left a message for the patient to call back. He will need to sign a release form for records to be faxed or the new cardiologist will need to send a signed consent/record request.

## 2022-09-23 ENCOUNTER — Telehealth: Payer: Self-pay | Admitting: Internal Medicine

## 2022-09-23 NOTE — Telephone Encounter (Signed)
Contacted [name] [name] to schedule their annual wellness visit. Patient declined to schedule AWV at this time. [place]red [place] of state.  [facility] Care Guide [facility] AWV TEAM Direct Dial: [phone]
